# Patient Record
Sex: Female | Born: 1940 | Race: White | Hispanic: No | Marital: Married | Smoking: Former smoker
Health system: Southern US, Community
[De-identification: ages and names within clinical notes are randomized; demographics above are authoritative.]

## PROBLEM LIST (undated history)

## (undated) DIAGNOSIS — T7840XA Allergy, unspecified, initial encounter: Secondary | ICD-10-CM

## (undated) DIAGNOSIS — E079 Disorder of thyroid, unspecified: Secondary | ICD-10-CM

## (undated) DIAGNOSIS — G20 Parkinson's disease: Secondary | ICD-10-CM

## (undated) DIAGNOSIS — F0390 Unspecified dementia without behavioral disturbance: Secondary | ICD-10-CM

## (undated) DIAGNOSIS — G20A1 Parkinson's disease without dyskinesia, without mention of fluctuations: Secondary | ICD-10-CM

## (undated) DIAGNOSIS — E785 Hyperlipidemia, unspecified: Secondary | ICD-10-CM

## (undated) DIAGNOSIS — E039 Hypothyroidism, unspecified: Secondary | ICD-10-CM

## (undated) DIAGNOSIS — H269 Unspecified cataract: Secondary | ICD-10-CM

## (undated) HISTORY — DX: Parkinson's disease: G20

## (undated) HISTORY — DX: Allergy, unspecified, initial encounter: T78.40XA

## (undated) HISTORY — PX: CATARACT EXTRACTION, BILATERAL: SHX1313

## (undated) HISTORY — PX: TONSILECTOMY, ADENOIDECTOMY, BILATERAL MYRINGOTOMY AND TUBES: SHX2538

## (undated) HISTORY — DX: Unspecified cataract: H26.9

## (undated) HISTORY — DX: Parkinson's disease without dyskinesia, without mention of fluctuations: G20.A1

## (undated) HISTORY — PX: ABDOMINAL HYSTERECTOMY: SHX81

## (undated) HISTORY — DX: Hyperlipidemia, unspecified: E78.5

## (undated) HISTORY — PX: EYE SURGERY: SHX253

---

## 2008-03-21 ENCOUNTER — Emergency Department (HOSPITAL_COMMUNITY): Admission: EM | Admit: 2008-03-21 | Discharge: 2008-03-21 | Payer: Self-pay | Admitting: Emergency Medicine

## 2010-10-28 ENCOUNTER — Emergency Department (HOSPITAL_BASED_OUTPATIENT_CLINIC_OR_DEPARTMENT_OTHER)
Admission: EM | Admit: 2010-10-28 | Discharge: 2010-10-28 | Disposition: A | Discharge status: Home / Self Care | Payer: Medicare Other | Attending: Emergency Medicine | Admitting: Emergency Medicine

## 2010-10-28 ENCOUNTER — Emergency Department (INDEPENDENT_AMBULATORY_CARE_PROVIDER_SITE_OTHER): Payer: Medicare Other

## 2010-10-28 DIAGNOSIS — R079 Chest pain, unspecified: Secondary | ICD-10-CM

## 2010-10-28 DIAGNOSIS — S0100XA Unspecified open wound of scalp, initial encounter: Secondary | ICD-10-CM | POA: Insufficient documentation

## 2010-10-28 DIAGNOSIS — Y9301 Activity, walking, marching and hiking: Secondary | ICD-10-CM | POA: Insufficient documentation

## 2010-10-28 DIAGNOSIS — I10 Essential (primary) hypertension: Secondary | ICD-10-CM | POA: Insufficient documentation

## 2010-10-28 DIAGNOSIS — W010XXA Fall on same level from slipping, tripping and stumbling without subsequent striking against object, initial encounter: Secondary | ICD-10-CM | POA: Insufficient documentation

## 2010-10-28 DIAGNOSIS — S1093XA Contusion of unspecified part of neck, initial encounter: Secondary | ICD-10-CM

## 2010-10-28 DIAGNOSIS — S0003XA Contusion of scalp, initial encounter: Secondary | ICD-10-CM

## 2010-10-28 DIAGNOSIS — G319 Degenerative disease of nervous system, unspecified: Secondary | ICD-10-CM | POA: Insufficient documentation

## 2010-11-06 ENCOUNTER — Ambulatory Visit: Payer: Medicare Other | Attending: Diagnostic Neuroimaging | Admitting: Physical Therapy

## 2010-11-06 DIAGNOSIS — IMO0001 Reserved for inherently not codable concepts without codable children: Secondary | ICD-10-CM | POA: Insufficient documentation

## 2010-11-06 DIAGNOSIS — R269 Unspecified abnormalities of gait and mobility: Secondary | ICD-10-CM | POA: Insufficient documentation

## 2010-11-15 ENCOUNTER — Ambulatory Visit: Payer: Medicare Other | Admitting: Physical Therapy

## 2010-11-17 ENCOUNTER — Ambulatory Visit: Payer: Medicare Other | Admitting: Physical Therapy

## 2010-11-22 ENCOUNTER — Ambulatory Visit: Payer: Medicare Other | Admitting: Physical Therapy

## 2010-11-23 ENCOUNTER — Ambulatory Visit: Payer: Medicare Other | Admitting: Physical Therapy

## 2010-11-24 ENCOUNTER — Ambulatory Visit: Payer: Medicare Other | Attending: Diagnostic Neuroimaging | Admitting: Physical Therapy

## 2010-11-24 DIAGNOSIS — R269 Unspecified abnormalities of gait and mobility: Secondary | ICD-10-CM | POA: Insufficient documentation

## 2010-11-24 DIAGNOSIS — IMO0001 Reserved for inherently not codable concepts without codable children: Secondary | ICD-10-CM | POA: Insufficient documentation

## 2010-11-27 ENCOUNTER — Ambulatory Visit: Payer: Medicare Other | Admitting: Physical Therapy

## 2010-11-30 ENCOUNTER — Ambulatory Visit: Payer: Medicare Other | Admitting: Physical Therapy

## 2010-12-04 ENCOUNTER — Ambulatory Visit: Payer: Medicare Other | Admitting: Physical Therapy

## 2010-12-07 ENCOUNTER — Ambulatory Visit: Payer: Medicare Other | Admitting: Physical Therapy

## 2010-12-11 ENCOUNTER — Ambulatory Visit: Payer: Medicare Other | Admitting: Physical Therapy

## 2010-12-12 ENCOUNTER — Ambulatory Visit: Payer: Medicare Other | Admitting: Physical Therapy

## 2010-12-14 ENCOUNTER — Ambulatory Visit: Payer: Medicare Other | Admitting: Physical Therapy

## 2011-11-09 DIAGNOSIS — R2689 Other abnormalities of gait and mobility: Secondary | ICD-10-CM | POA: Insufficient documentation

## 2011-11-09 DIAGNOSIS — R4189 Other symptoms and signs involving cognitive functions and awareness: Secondary | ICD-10-CM | POA: Insufficient documentation

## 2013-02-22 ENCOUNTER — Ambulatory Visit (INDEPENDENT_AMBULATORY_CARE_PROVIDER_SITE_OTHER): Payer: Medicare Other | Admitting: Family Medicine

## 2013-02-22 VITALS — BP 136/80 | HR 90 | Temp 98.2°F | Resp 17

## 2013-02-22 DIAGNOSIS — L0201 Cutaneous abscess of face: Secondary | ICD-10-CM

## 2013-02-22 DIAGNOSIS — L03211 Cellulitis of face: Secondary | ICD-10-CM

## 2013-02-22 DIAGNOSIS — R300 Dysuria: Secondary | ICD-10-CM

## 2013-02-22 LAB — POCT CBC
Granulocyte percent: 71.5 %G (ref 37–80)
HCT, POC: 42.5 % (ref 37.7–47.9)
Hemoglobin: 13.4 g/dL (ref 12.2–16.2)
Lymph, poc: 1.4 (ref 0.6–3.4)
MCH, POC: 28.9 pg (ref 27–31.2)
MCHC: 31.5 g/dL — AB (ref 31.8–35.4)
MCV: 91.5 fL (ref 80–97)
MID (cbc): 0.4 (ref 0–0.9)
MPV: 8.9 fL (ref 0–99.8)
POC Granulocyte: 4.6 (ref 2–6.9)
POC LYMPH PERCENT: 21.9 %L (ref 10–50)
POC MID %: 6.6 %M (ref 0–12)
Platelet Count, POC: 287 10*3/uL (ref 142–424)
RBC: 4.64 M/uL (ref 4.04–5.48)
RDW, POC: 14.8 %
WBC: 6.4 10*3/uL (ref 4.6–10.2)

## 2013-02-22 LAB — POCT URINALYSIS DIPSTICK
Bilirubin, UA: NEGATIVE
Glucose, UA: NEGATIVE
Ketones, UA: NEGATIVE
Nitrite, UA: NEGATIVE
Protein, UA: NEGATIVE
Spec Grav, UA: 1.015
Urobilinogen, UA: 0.2
pH, UA: 7

## 2013-02-22 LAB — POCT UA - MICROSCOPIC ONLY
Bacteria, U Microscopic: NEGATIVE
Casts, Ur, LPF, POC: NEGATIVE
Crystals, Ur, HPF, POC: NEGATIVE
Mucus, UA: NEGATIVE
Yeast, UA: NEGATIVE

## 2013-02-22 MED ORDER — CEPHALEXIN 250 MG PO CAPS: 250.0000 mg | ORAL_CAPSULE | Freq: Three times a day (TID) | ORAL | Status: DC

## 2013-02-22 NOTE — Progress Notes (Signed)
72 year old woman was brought in with her husband because of facial rash for one month. She's been using Neosporin and actually applied a Band-Aid over the area with subsequent increase in erythema. The rash is located on her left cheek is progressed by erythema and thickening of the skin. She has no fever or itching.  Patient also has some dysuria over the last week. She has a followup appointment with her PCP in late September.  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 She does have some blurring of her vision and has difficulty getting started. She has no tremor is mentally quite sharp.  Objective: Alert with good eye contact and conjugate gaze. Skin: 4 mm blemish on left cheek with surrounding erythema corresponding to Band-Aid location. It is covered with petroleum jelly type application.  Patient is a wheelchair, has no edema, is alert and appropriate.  Results for orders placed in visit on 02/22/13  POCT CBC      Result Value Range   WBC 6.4  4.6 - 10.2 K/uL   Lymph, poc 1.4  0.6 - 3.4   POC LYMPH PERCENT 21.9  10 - 50 %L   MID (cbc) 0.4  0 - 0.9   POC MID % 6.6  0 - 12 %M   POC Granulocyte 4.6  2 - 6.9   Granulocyte percent 71.5  37 - 80 %G   RBC 4.64  4.04 - 5.48 M/uL   Hemoglobin 13.4  12.2 - 16.2 g/dL   HCT, POC 30.8  65.7 - 47.9 %   MCV 91.5  80 - 97 fL   MCH, POC 28.9  27 - 31.2 pg   MCHC 31.5 (*) 31.8 - 35.4 g/dL   RDW, POC 84.6     Platelet Count, POC 287  142 - 424 K/uL   MPV 8.9  0 - 99.8 fL  POCT URINALYSIS DIPSTICK      Result Value Range   Color, UA yellow     Clarity, UA clear     Glucose, UA neg     Bilirubin, UA neg     Ketones, UA neg     Spec Grav, UA 1.015     Blood, UA trace-intact     pH, UA 7.0     Protein, UA neg     Urobilinogen, UA 0.2     Nitrite, UA neg     Leukocytes, UA Trace    POCT UA - MICROSCOPIC ONLY      Result Value Range   WBC, Ur, HPF, POC 0-4     RBC, urine,  microscopic 1-6     Bacteria, U Microscopic neg     Mucus, UA neg     Epithelial cells, urine per micros 0-2     Crystals, Ur, HPF, POC neg     Casts, Ur, LPF, POC neg     Yeast, UA neg     Assessment: Mild cellulitis left cheek with secondary contact dermatitis. Possible early UTI

## 2013-02-23 LAB — URINE CULTURE
Colony Count: NO GROWTH
Organism ID, Bacteria: NO GROWTH

## 2013-05-07 ENCOUNTER — Ambulatory Visit
Admission: RE | Admit: 2013-05-07 | Discharge: 2013-05-07 | Disposition: A | Discharge status: Home / Self Care | Payer: Medicare Other | Source: Ambulatory Visit | Attending: Family Medicine | Admitting: Family Medicine

## 2013-05-07 ENCOUNTER — Ambulatory Visit (INDEPENDENT_AMBULATORY_CARE_PROVIDER_SITE_OTHER): Payer: Medicare Other | Admitting: Family Medicine

## 2013-05-07 ENCOUNTER — Telehealth: Payer: Self-pay | Admitting: Radiology

## 2013-05-07 VITALS — BP 125/76 | HR 87 | Temp 97.4°F | Resp 16 | Ht 63.5 in | Wt 161.2 lb

## 2013-05-07 DIAGNOSIS — S0093XA Contusion of unspecified part of head, initial encounter: Secondary | ICD-10-CM

## 2013-05-07 DIAGNOSIS — R51 Headache: Secondary | ICD-10-CM

## 2013-05-07 DIAGNOSIS — S0990XA Unspecified injury of head, initial encounter: Secondary | ICD-10-CM

## 2013-05-07 DIAGNOSIS — S0003XA Contusion of scalp, initial encounter: Secondary | ICD-10-CM

## 2013-05-07 NOTE — Telephone Encounter (Signed)
Patients CT report is in the computer, patient has gone home.

## 2013-05-07 NOTE — Progress Notes (Addendum)
Subjective:    Patient ID: Elizabeth Bauer, female    DOB: 30-Jun-1940, 72 y.o.   MRN: [id number]  This chart was scribed for Meredith Staggers, MD by Dorothey Baseman, ED Scribe.   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 Female with a history of Parkinson's Disease who presents to [facility] complaining of a head injury that occurred 4 days ago when she states that she fell onto a tile, bathroom floor and hit the back of her head. Patient reports that she was using a door for support, but that she beleives the door swung away from her and she lost her balance. She reports an associated knot to the back of the head that she states appears to be improving at this time. She reports some mild associated nausea after the incident and one episode of pink-colored discharge when she blew her nose that has since resolved. She reports applying ice to the area and taking Aleve at home with moderate, temporary relief of her symptoms for 2 days, but states that a headache presented this morning that woke her from sleep. She denies syncope, emesis, visual disturbance, lightheadedness, dizziness, or new weakness. Patient denies any daily anticoagulant use, but states that she takes a low-dose of Aspirin occasionally.  There are no active problems to display for this patient.  Past Medical History  Diagnosis Date  . Cataract   . Parkinson disease    Past Surgical History  Procedure Laterality Date  . Eye surgery    . Abdominal hysterectomy    . Tonsilectomy, adenoidectomy, bilateral myringotomy and tubes     No Known Allergies Prior to Admission medications   Medication Sig Start Date End Date Taking? Authorizing Provider  carbidopa-levodopa (SINEMET IR) 25-100 MG per tablet Take 1 tablet by mouth 3 (three) times daily.   Yes Historical Provider, MD  levothyroxine (SYNTHROID, LEVOTHROID) 112 MCG tablet Take 112 mcg by mouth daily before breakfast.   Yes Historical Provider, MD   cephALEXin (KEFLEX) 250 MG capsule Take 1 capsule (250 mg total) by mouth 3 (three) times daily. 02/22/13   Elvina Sidle, MD   History   Social History  . Marital Status: Married    Spouse Name: N/A    Number of Children: N/A  . Years of Education: N/A   Occupational History  . Not on file.   Social History Main Topics  . Smoking status: Never Smoker   . Smokeless tobacco: Not on file  . Alcohol Use: Yes  . Drug Use: No  . Sexual Activity: Not on file   Other Topics Concern  . Not on file   Social History Narrative  . No narrative on file   Review of Systems  Eyes: Negative for visual disturbance.  Gastrointestinal: Positive for nausea. Negative for vomiting.  Neurological: Positive for headaches. Negative for dizziness, syncope, weakness and light-headedness.      Objective:   Physical Exam  Nursing note and vitals reviewed. Constitutional: She is oriented to person, place, and time. She appears well-developed and well-nourished. No distress.  HENT:  Head: Normocephalic. Head is without raccoon's eyes and without Battle's sign.  Right Ear: Tympanic membrane, external ear and ear canal normal. No hemotympanum.  Left Ear: Tympanic membrane, external ear and ear canal normal. No hemotympanum.  Faded ecchymosis. Mild soft tissue swelling to the left occiput. Palate equal elevation.   Eyes: Conjunctivae and EOM are normal. Pupils are equal, round, and reactive  to light. Right eye exhibits no discharge. Left eye exhibits no discharge.  Some cerumen in the right canal. No nystagmus.   Neck: Normal range of motion. Neck supple.  No carotid bruit.  Cardiovascular: Normal rate, regular rhythm and normal heart sounds.   No murmur heard. Pulmonary/Chest: Effort normal and breath sounds normal. No respiratory distress. She has no wheezes. She has no rales.  Abdominal: She exhibits no distension.  Musculoskeletal: Normal range of motion.  Neurological: She is alert and  oriented to person, place, and time.  Equal facial movements and strength. Equal strength of upper and lower extremities bilaterally. Negative Romberg. Negative pronator drift.  Skin: Skin is warm and dry.  Psychiatric: She has a normal mood and affect. Her behavior is normal.   BP 125/76  Pulse 87  Temp(Src) 97.4 F (36.3 C) (Oral)  Resp 16  Ht 5' 3.5" (1.613 m)  Wt 161 lb 3.2 oz (73.12 kg)  BMI 28.10 kg/m2  SpO2 96%     Assessment & Plan:  9:14 AM- Will order a CT of the head. Advised patient to take Tylenol and to apply cold compresses to the area at home to manage her symptoms. Discussed treatment plan with patient at bedside and patient verbalized agreement.   Elizabeth Bauer is a 72 y.o. female Headache(784.0) - Plan: CT Head Wo Contrast  Head injury, unspecified - Plan: CT Head Wo Contrast  Contusion of head, initial encounter - Plan: CT Head Wo Contrast  Contusion of occiput with hematoma, initial improvement then recurrence of HA to wake from sleep this am - will obtain CT head today. nonfocal exam and HA free at present, so will have this done outpatient.   No orders of the defined types were placed in this encounter.   Patient Instructions  We will schedule the CT scan today, but return to the clinic or go to the nearest emergency room if any of your symptoms worsen or new symptoms occur. Tylenol if needed.  HEAD INJURY If any of the following occur notify your physician or go to the Hospital Emergency Department: . Increased drowsiness, stupor or loss of consciousness . Restlessness or convulsions (fits) . Paralysis in arms or legs . Temperature above 100 F . Vomiting . Severe headache . Blood or clear fluid dripping from the nose or ears . Stiffness of the neck . Dizziness or blurred vision . Pulsating pain in the eye . Unequal pupils of eye . Personality changes . Any other unusual symptoms PRECAUTIONS . Keep head elevated at all times for the first 24  hours (Elevate mattress if pillow is ineffective) . Do not take tranquilizers, sedatives, narcotics or alcohol . Avoid aspirin. Use only acetaminophen (e.g. Tylenol) or ibuprofen (e.g. Advil) for relief of pain. Follow directions on the bottle for dosage. . Use ice packs for comfort MEDICATIONS Use medications only as directed by your physician     I personally performed the services described in this documentation, which was scribed in my presence. The recorded information has been reviewed and considered, and addended by me as needed.     1:11 PM CT report noted. No acute findings. 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  No acute intracranial findings or skull fracture.   called home number - left message that I will try other number.  Reached patient on cell: advised of results and plan of care as per OV earlier, with continued ER/RTC head injury precautions. Understanding  expressed.

## 2013-05-07 NOTE — Patient Instructions (Signed)
We will schedule the CT scan today, but return to the clinic or go to the nearest emergency room if any of your symptoms worsen or new symptoms occur. Tylenol if needed.  HEAD INJURY If any of the following occur notify your physician or go to the Hospital Emergency Department: . Increased drowsiness, stupor or loss of consciousness . Restlessness or convulsions (fits) . Paralysis in arms or legs . Temperature above 100 F . Vomiting . Severe headache . Blood or clear fluid dripping from the nose or ears . Stiffness of the neck . Dizziness or blurred vision . Pulsating pain in the eye . Unequal pupils of eye . Personality changes . Any other unusual symptoms PRECAUTIONS . Keep head elevated at all times for the first 24 hours (Elevate mattress if pillow is ineffective) . Do not take tranquilizers, sedatives, narcotics or alcohol . Avoid aspirin. Use only acetaminophen (e.g. Tylenol) or ibuprofen (e.g. Advil) for relief of pain. Follow directions on the bottle for dosage. . Use ice packs for comfort MEDICATIONS Use medications only as directed by your physician

## 2015-07-01 DIAGNOSIS — R310 Gross hematuria: Secondary | ICD-10-CM | POA: Diagnosis not present

## 2015-07-01 DIAGNOSIS — N390 Urinary tract infection, site not specified: Secondary | ICD-10-CM | POA: Diagnosis not present

## 2015-07-01 DIAGNOSIS — Z0000 Encounter for general adult medical examination without abnormal findings: Secondary | ICD-10-CM | POA: Diagnosis not present

## 2015-07-01 DIAGNOSIS — N3946 Mixed incontinence: Secondary | ICD-10-CM | POA: Diagnosis not present

## 2015-07-07 DIAGNOSIS — R269 Unspecified abnormalities of gait and mobility: Secondary | ICD-10-CM | POA: Diagnosis not present

## 2015-07-07 DIAGNOSIS — G20 Parkinson's disease: Secondary | ICD-10-CM | POA: Diagnosis not present

## 2015-07-11 DIAGNOSIS — Z0000 Encounter for general adult medical examination without abnormal findings: Secondary | ICD-10-CM | POA: Diagnosis not present

## 2015-07-11 DIAGNOSIS — N3001 Acute cystitis with hematuria: Secondary | ICD-10-CM | POA: Diagnosis not present

## 2015-07-11 DIAGNOSIS — N362 Urethral caruncle: Secondary | ICD-10-CM | POA: Diagnosis not present

## 2015-07-11 DIAGNOSIS — R310 Gross hematuria: Secondary | ICD-10-CM | POA: Diagnosis not present

## 2015-08-07 DIAGNOSIS — R269 Unspecified abnormalities of gait and mobility: Secondary | ICD-10-CM | POA: Diagnosis not present

## 2015-08-07 DIAGNOSIS — G20 Parkinson's disease: Secondary | ICD-10-CM | POA: Diagnosis not present

## 2015-09-01 DIAGNOSIS — I10 Essential (primary) hypertension: Secondary | ICD-10-CM | POA: Diagnosis not present

## 2015-09-01 DIAGNOSIS — R1310 Dysphagia, unspecified: Secondary | ICD-10-CM | POA: Diagnosis not present

## 2015-09-01 DIAGNOSIS — G4752 REM sleep behavior disorder: Secondary | ICD-10-CM | POA: Diagnosis not present

## 2015-09-01 DIAGNOSIS — R1319 Other dysphagia: Secondary | ICD-10-CM | POA: Diagnosis not present

## 2015-09-01 DIAGNOSIS — G20 Parkinson's disease: Secondary | ICD-10-CM | POA: Diagnosis not present

## 2015-09-01 DIAGNOSIS — Z7982 Long term (current) use of aspirin: Secondary | ICD-10-CM | POA: Diagnosis not present

## 2015-09-04 DIAGNOSIS — R269 Unspecified abnormalities of gait and mobility: Secondary | ICD-10-CM | POA: Diagnosis not present

## 2015-09-04 DIAGNOSIS — G20 Parkinson's disease: Secondary | ICD-10-CM | POA: Diagnosis not present

## 2015-10-05 DIAGNOSIS — G20 Parkinson's disease: Secondary | ICD-10-CM | POA: Diagnosis not present

## 2015-10-05 DIAGNOSIS — R269 Unspecified abnormalities of gait and mobility: Secondary | ICD-10-CM | POA: Diagnosis not present

## 2015-10-27 DIAGNOSIS — R633 Feeding difficulties: Secondary | ICD-10-CM | POA: Diagnosis not present

## 2015-10-27 DIAGNOSIS — R1319 Other dysphagia: Secondary | ICD-10-CM | POA: Diagnosis not present

## 2015-10-27 DIAGNOSIS — G20 Parkinson's disease: Secondary | ICD-10-CM | POA: Diagnosis not present

## 2015-11-04 DIAGNOSIS — G20 Parkinson's disease: Secondary | ICD-10-CM | POA: Diagnosis not present

## 2015-11-04 DIAGNOSIS — R269 Unspecified abnormalities of gait and mobility: Secondary | ICD-10-CM | POA: Diagnosis not present

## 2015-11-08 DIAGNOSIS — G20 Parkinson's disease: Secondary | ICD-10-CM | POA: Diagnosis not present

## 2015-11-08 DIAGNOSIS — Z0000 Encounter for general adult medical examination without abnormal findings: Secondary | ICD-10-CM | POA: Diagnosis not present

## 2015-11-08 DIAGNOSIS — E039 Hypothyroidism, unspecified: Secondary | ICD-10-CM | POA: Diagnosis not present

## 2015-11-08 DIAGNOSIS — K229 Disease of esophagus, unspecified: Secondary | ICD-10-CM | POA: Diagnosis not present

## 2015-11-14 DIAGNOSIS — E039 Hypothyroidism, unspecified: Secondary | ICD-10-CM | POA: Diagnosis not present

## 2015-11-14 DIAGNOSIS — G479 Sleep disorder, unspecified: Secondary | ICD-10-CM | POA: Diagnosis not present

## 2015-11-14 DIAGNOSIS — R350 Frequency of micturition: Secondary | ICD-10-CM | POA: Diagnosis not present

## 2015-11-14 DIAGNOSIS — Z79899 Other long term (current) drug therapy: Secondary | ICD-10-CM | POA: Diagnosis not present

## 2015-11-14 DIAGNOSIS — G231 Progressive supranuclear ophthalmoplegia [Steele-Richardson-Olszewski]: Secondary | ICD-10-CM | POA: Diagnosis not present

## 2015-11-14 DIAGNOSIS — R2681 Unsteadiness on feet: Secondary | ICD-10-CM | POA: Diagnosis not present

## 2015-11-14 DIAGNOSIS — J309 Allergic rhinitis, unspecified: Secondary | ICD-10-CM | POA: Diagnosis not present

## 2015-11-16 DIAGNOSIS — R1312 Dysphagia, oropharyngeal phase: Secondary | ICD-10-CM | POA: Diagnosis not present

## 2015-12-05 DIAGNOSIS — G20 Parkinson's disease: Secondary | ICD-10-CM | POA: Diagnosis not present

## 2015-12-05 DIAGNOSIS — R269 Unspecified abnormalities of gait and mobility: Secondary | ICD-10-CM | POA: Diagnosis not present

## 2016-01-04 DIAGNOSIS — G20 Parkinson's disease: Secondary | ICD-10-CM | POA: Diagnosis not present

## 2016-01-04 DIAGNOSIS — R269 Unspecified abnormalities of gait and mobility: Secondary | ICD-10-CM | POA: Diagnosis not present

## 2016-01-17 ENCOUNTER — Emergency Department (HOSPITAL_COMMUNITY)
Admission: EM | Admit: 2016-01-17 | Discharge: 2016-01-17 | Disposition: A | Discharge status: Home / Self Care | Payer: Medicare HMO | Attending: Emergency Medicine | Admitting: Emergency Medicine

## 2016-01-17 ENCOUNTER — Encounter (HOSPITAL_COMMUNITY): Payer: Self-pay

## 2016-01-17 ENCOUNTER — Emergency Department (HOSPITAL_COMMUNITY): Payer: Medicare HMO

## 2016-01-17 DIAGNOSIS — R296 Repeated falls: Secondary | ICD-10-CM | POA: Diagnosis not present

## 2016-01-17 DIAGNOSIS — S0990XA Unspecified injury of head, initial encounter: Secondary | ICD-10-CM | POA: Diagnosis present

## 2016-01-17 DIAGNOSIS — Z87891 Personal history of nicotine dependence: Secondary | ICD-10-CM | POA: Diagnosis not present

## 2016-01-17 DIAGNOSIS — Y999 Unspecified external cause status: Secondary | ICD-10-CM | POA: Insufficient documentation

## 2016-01-17 DIAGNOSIS — Y92002 Bathroom of unspecified non-institutional (private) residence single-family (private) house as the place of occurrence of the external cause: Secondary | ICD-10-CM | POA: Diagnosis not present

## 2016-01-17 DIAGNOSIS — Y939 Activity, unspecified: Secondary | ICD-10-CM | POA: Diagnosis not present

## 2016-01-17 DIAGNOSIS — Z7982 Long term (current) use of aspirin: Secondary | ICD-10-CM | POA: Diagnosis not present

## 2016-01-17 DIAGNOSIS — Z79899 Other long term (current) drug therapy: Secondary | ICD-10-CM | POA: Insufficient documentation

## 2016-01-17 DIAGNOSIS — G20 Parkinson's disease: Secondary | ICD-10-CM | POA: Diagnosis not present

## 2016-01-17 DIAGNOSIS — S40022A Contusion of left upper arm, initial encounter: Secondary | ICD-10-CM | POA: Diagnosis not present

## 2016-01-17 DIAGNOSIS — W1800XA Striking against unspecified object with subsequent fall, initial encounter: Secondary | ICD-10-CM | POA: Insufficient documentation

## 2016-01-17 DIAGNOSIS — N390 Urinary tract infection, site not specified: Secondary | ICD-10-CM

## 2016-01-17 DIAGNOSIS — W19XXXA Unspecified fall, initial encounter: Secondary | ICD-10-CM

## 2016-01-17 LAB — CBC
HCT: 40.7 % (ref 36.0–46.0)
Hemoglobin: 13.2 g/dL (ref 12.0–15.0)
MCH: 29.4 pg (ref 26.0–34.0)
MCHC: 32.4 g/dL (ref 30.0–36.0)
MCV: 90.6 fL (ref 78.0–100.0)
Platelets: 329 10*3/uL (ref 150–400)
RBC: 4.49 MIL/uL (ref 3.87–5.11)
RDW: 13.9 % (ref 11.5–15.5)
WBC: 6.2 10*3/uL (ref 4.0–10.5)

## 2016-01-17 LAB — URINALYSIS, ROUTINE W REFLEX MICROSCOPIC
Bilirubin Urine: NEGATIVE
Glucose, UA: NEGATIVE mg/dL
Hgb urine dipstick: NEGATIVE
Ketones, ur: NEGATIVE mg/dL
Nitrite: NEGATIVE
Protein, ur: NEGATIVE mg/dL
Specific Gravity, Urine: 1.014 (ref 1.005–1.030)
pH: 7 (ref 5.0–8.0)

## 2016-01-17 LAB — BASIC METABOLIC PANEL
Anion gap: 6 (ref 5–15)
BUN: 30 mg/dL — ABNORMAL HIGH (ref 6–20)
CO2: 27 mmol/L (ref 22–32)
Calcium: 9.5 mg/dL (ref 8.9–10.3)
Chloride: 106 mmol/L (ref 101–111)
Creatinine, Ser: 0.81 mg/dL (ref 0.44–1.00)
GFR calc Af Amer: >60 mL/min (ref >60)
GFR calc non Af Amer: >60 mL/min (ref >60)
Glucose, Bld: 154 mg/dL — ABNORMAL HIGH (ref 65–99)
Potassium: 3.5 mmol/L (ref 3.5–5.1)
Sodium: 139 mmol/L (ref 135–145)

## 2016-01-17 LAB — URINE MICROSCOPIC-ADD ON: RBC / HPF: NONE SEEN RBC/hpf (ref 0–5)

## 2016-01-17 LAB — CBG MONITORING, ED: Glucose-Capillary: 117 mg/dL — ABNORMAL HIGH (ref 65–99)

## 2016-01-17 MED ORDER — CEPHALEXIN 500 MG PO CAPS: 500.0000 mg | ORAL_CAPSULE | Freq: Four times a day (QID) | ORAL | 0 refills | Status: DC

## 2016-01-17 NOTE — Discharge Instructions (Signed)
Take your medications as prescribed. You may take Tylenol as prescribed over the counter as needed for pain relief. You may also apply ice your left arm for 15 minutes 3-4 times daily as needed for pain. Take all of your antibiotic as prescribed until finished. Follow-up with your family doctor within the next week. Recommend following up with her neurologist and a scheduled appointment. Please return to the Emergency Department if symptoms worsen or new onset of fever, headache, visual changes, confusion, chest pain, shortness of breath, vomiting, abdominal pain, weakness.

## 2016-01-17 NOTE — ED Notes (Signed)
Patient transported to CT 

## 2016-01-17 NOTE — ED Provider Notes (Signed)
[facility] DEPT Provider Note   CSN: [id number] Arrival date & time: 01/17/16  1633  First Provider Contact:  First MD Initiated Contact with Patient 01/17/16 1820        History   Chief Complaint Chief Complaint  Patient presents with  . Weakness  . Fall    HPI Elizabeth Bauer is a 75 y.o. female.  Patient is a 75 year old female past medical history of parkinson's/progressive supranuclear palsy who presents the ED with complaint of multiple falls. Patient reports she has had 3 falls over the past week. She reports hitting her head on the bathroom vanity 2 days ago. Denies LOC. Patient reports history of falls due to having balance problems related to her parkinson's but notes she typically does not have falls as frequently as she has had over the past week. Patient reports her most recent fall with this morning while she was in her bedroom closet on carpeted floor. She reports landing on her side. Patient endorses having a bruise to her left upper arm that is tender to palpation. Denies any other physician symptoms or pain. Denies fever, chills, headache, visual changes, lightheadedness, dizziness, cough, chest pain, shortness of breath, palpitations, abdominal pain, nausea, vomiting, diarrhea, urinary symptoms, numbness, tingling, weakness. Patient denies use of anticoagulants.    Weakness   Fall     Past Medical History:  Diagnosis Date  . Cataract   . Parkinson disease ([place])     There are no active problems to display for this patient.   Past Surgical History:  Procedure Laterality Date  . ABDOMINAL HYSTERECTOMY    . EYE SURGERY    . TONSILECTOMY, ADENOIDECTOMY, BILATERAL MYRINGOTOMY AND TUBES      OB History    No data available       Home Medications    Prior to Admission medications   Medication Sig Start Date End Date Taking? Authorizing Provider  aspirin EC 81 MG tablet Take 81 mg by mouth every evening.   Yes Historical Provider, MD  [place],  Vaccinium myrtillus, (BILBERRY PO) Take 1 tablet by mouth daily.   Yes Historical Provider, MD  BIOTIN PO Take 1 tablet by mouth 2 (two) times daily.   Yes Historical Provider, MD  carbidopa-levodopa (SINEMET IR) 25-100 MG per tablet Take 0.5 tablets by mouth 2 (two) times daily.    Yes Historical Provider, MD  cetirizine (ZYRTEC) 10 MG tablet Take 10 mg by mouth daily.   Yes Historical Provider, MD  clonazePAM (KLONOPIN) 0.5 MG tablet Take 0.5 mg by mouth at bedtime. 01/03/16  Yes Historical Provider, MD  diphenhydramine-acetaminophen (TYLENOL PM) 25-500 MG TABS tablet Take 2 tablets by mouth at bedtime.   Yes Historical Provider, MD  fluticasone (FLONASE) 50 MCG/ACT nasal spray Place 1-2 sprays into both nostrils daily as needed for allergies or rhinitis.   Yes Historical Provider, MD  levothyroxine (SYNTHROID, LEVOTHROID) 88 MCG tablet Take 88 mcg by mouth daily. 11/25/15  Yes Historical Provider, MD  Multiple Vitamin (MULTI-VITAMINS) TABS Take 1 tablet by mouth daily.   Yes Historical Provider, MD  Probiotic CAPS Take 1 capsule by mouth daily.   Yes Historical Provider, MD  sertraline (ZOLOFT) 50 MG tablet Take 25 mg by mouth daily. 01/04/16  Yes Historical Provider, MD  TURMERIC PO Take 1 tablet by mouth daily.   Yes Historical Provider, MD  VESICARE 10 MG tablet Take 5 mg by mouth at bedtime.  11/22/15  Yes Historical Provider, MD    Family History History  reviewed. No pertinent family history.  Social History Social History  Substance Use Topics  . Smoking status: Former Research scientist (life sciences)  . Smokeless tobacco: Never Used  . Alcohol use Yes     Allergies   Review of patient's allergies indicates no known allergies.   Review of Systems Review of Systems  Skin: Positive for wound (bruise).  All other systems reviewed and are negative.    Physical Exam Updated Vital Signs BP 154/79 (BP Location: Left Arm)   Pulse 80   Temp 97.9 F (36.6 C) (Oral)   Resp 18   SpO2 97%   Physical Exam   Constitutional: She is oriented to person, place, and time. She appears well-developed and well-nourished. No distress.  HENT:  Head: Normocephalic and atraumatic. Head is without raccoon's eyes, without Battle's sign, without abrasion, without contusion and without laceration.  Right Ear: Tympanic membrane normal. No hemotympanum.  Left Ear: Tympanic membrane normal. No hemotympanum.  Nose: Nose normal. No sinus tenderness, nasal deformity, septal deviation or nasal septal hematoma. No epistaxis.  Mouth/Throat: Uvula is midline, oropharynx is clear and moist and mucous membranes are normal. No oropharyngeal exudate, posterior oropharyngeal edema, posterior oropharyngeal erythema or tonsillar abscesses.  Eyes: Conjunctivae and EOM are normal. Pupils are equal, round, and reactive to light. Right eye exhibits no discharge. Left eye exhibits no discharge. No scleral icterus.  Neck: Normal range of motion. Neck supple.  Cardiovascular: Normal rate, regular rhythm, normal heart sounds and intact distal pulses.   Pulmonary/Chest: Effort normal and breath sounds normal. No respiratory distress. She has no wheezes. She has no rales. She exhibits no tenderness.  Abdominal: Soft. Bowel sounds are normal. She exhibits no distension and no mass. There is no tenderness. There is no rebound and no guarding. No hernia.  Musculoskeletal: Normal range of motion. She exhibits no edema.  No cervical, thoracic, or lumbar spine midline TTP. Full ROM of bilateral upper and lower extremities with 5/5 strength. FROM of bilateral hips, no pelvic instability. 2+ radial and PT pulses. Sensation grossly intact.   Neurological: She is alert and oriented to person, place, and time. She has normal strength. No cranial nerve deficit or sensory deficit. She displays a negative Romberg sign. Coordination and gait normal.  Skin: Skin is warm and dry. She is not diaphoretic.  Large ecchymoses noted to left upper arm.  Nursing  note and vitals reviewed.    ED Treatments / Results  Labs (all labs ordered are listed, but only abnormal results are displayed) Labs Reviewed  BASIC METABOLIC PANEL - Abnormal; Notable for the following:       Result Value   Glucose, Bld 154 (*)    BUN 30 (*)    All other components within normal limits  CBG MONITORING, ED - Abnormal; Notable for the following:    Glucose-Capillary 117 (*)    All other components within normal limits  CBC  URINALYSIS, ROUTINE W REFLEX MICROSCOPIC (NOT AT [facility])    EKG  EKG Interpretation None       Radiology No results found.  Procedures Procedures (including critical care time)  Medications Ordered in ED Medications - No data to display   Initial Impression / Assessment and Plan / ED Course  I have reviewed the triage vital signs and the nursing notes.  Pertinent labs & imaging results that were available during my care of the patient were reviewed by me and considered in my medical decision making (see chart for details).  Clinical Course  Patient presents status post fall. Patient reports having multiple falls this week. History of chronic imbalance due to Parkinson's. Patient endorses hitting her head with her fall a few days ago. Denies LOC. Denies use of anticoagulants. VSS. Exam revealed ecchymoses to left upper arm, no evidence of head injury or trauma, no neuro deficits. CT head negative. UA consistent with UTI, urine culture sent. Remaining labs unremarkable. Patient able to stand and ambulate with cane which she reports using at home. Discussed results and plan for discharge with patient. Advised patient to follow up with her PCP this week and also recommended following up with her neurologist regarding further evaluation of her multiple falls. Discussed return precautions with patient.  Final Clinical Impressions(s) / ED Diagnoses   Final diagnoses:  None    New Prescriptions New Prescriptions   No medications on  file     Nona Dell, PA-C 01/18/16 [place], MD 01/18/16 1504

## 2016-01-17 NOTE — ED Triage Notes (Signed)
Pt has parkinson's.  Pt has had increase in falls since Thursday.  3 falls.  No LOC.  Pt denies pain on assessment.  Pt told to come and have her head checked out.  Pt did hit head on Thursday.  No unilateral weakness or deficit noted.

## 2016-01-20 LAB — URINE CULTURE: Culture: >=100000 — AB

## 2016-01-21 ENCOUNTER — Telehealth (HOSPITAL_BASED_OUTPATIENT_CLINIC_OR_DEPARTMENT_OTHER): Payer: Self-pay

## 2016-01-21 NOTE — Telephone Encounter (Signed)
Post ED Visit - Positive Culture Follow-up  Culture report reviewed by antimicrobial stewardship pharmacist:  []  Elenor Quinones, Pharm.D. []  Heide Guile, Pharm.D., BCPS []  Parks Neptune, Pharm.D. []  Alycia Rossetti, Pharm.D., BCPS []  [place], [place].D., BCPS, AAHIVP []  Legrand Como, Pharm.D., BCPS, AAHIVP []  Milus Glazier, Pharm.D. []  Rob Chester, Pharm.D. Ebony Hail Masters Pharm D Positive urine culture Treated with Cephalexin, organism sensitive to the same and no further patient follow-up is required at this time.  Genia Del 01/21/2016, 9:48 AM

## 2016-01-26 DIAGNOSIS — G231 Progressive supranuclear ophthalmoplegia [Steele-Richardson-Olszewski]: Secondary | ICD-10-CM | POA: Diagnosis not present

## 2016-01-26 DIAGNOSIS — N390 Urinary tract infection, site not specified: Secondary | ICD-10-CM | POA: Diagnosis not present

## 2016-01-26 DIAGNOSIS — W19XXXA Unspecified fall, initial encounter: Secondary | ICD-10-CM | POA: Diagnosis not present

## 2016-02-04 DIAGNOSIS — R269 Unspecified abnormalities of gait and mobility: Secondary | ICD-10-CM | POA: Diagnosis not present

## 2016-02-04 DIAGNOSIS — G20 Parkinson's disease: Secondary | ICD-10-CM | POA: Diagnosis not present

## 2016-02-08 DIAGNOSIS — N390 Urinary tract infection, site not specified: Secondary | ICD-10-CM | POA: Diagnosis not present

## 2016-02-23 DIAGNOSIS — R32 Unspecified urinary incontinence: Secondary | ICD-10-CM | POA: Diagnosis not present

## 2016-02-23 DIAGNOSIS — N3020 Other chronic cystitis without hematuria: Secondary | ICD-10-CM | POA: Diagnosis not present

## 2016-02-28 ENCOUNTER — Ambulatory Visit (INDEPENDENT_AMBULATORY_CARE_PROVIDER_SITE_OTHER): Payer: Medicare HMO | Admitting: Podiatry

## 2016-02-28 ENCOUNTER — Encounter: Payer: Self-pay | Admitting: Podiatry

## 2016-02-28 DIAGNOSIS — B351 Tinea unguium: Secondary | ICD-10-CM

## 2016-02-28 DIAGNOSIS — M79676 Pain in unspecified toe(s): Secondary | ICD-10-CM

## 2016-02-28 NOTE — Progress Notes (Signed)
   Subjective:    Patient ID: Elizabeth Bauer, female    DOB: 28-Jan-1941, 75 y.o.   MRN: [id number]  HPI: She presents today with a history of Parkinson's disease and is no longer able to cut her toenails. She is also concerned about a dark nodule to the medial aspect of the left heel.  Review of Systems  Constitutional: Positive for fatigue.  Musculoskeletal: Positive for gait problem.  Neurological: Positive for weakness.  Psychiatric/Behavioral: Positive for confusion.  All other systems reviewed and are negative.      Objective:   Physical Exam: Vital signs are stable she is alert and oriented 3. Pulses are palpable. She has good neurologic sensorium present as well as the monofilament and muscle strength is normal bilateral. Orthopedic evaluation does demonstrate mild hammertoe deformities bilateral cutaneous evaluation of a stress thick yellow dystrophic onychomycotic nails she also has a small venous lake to the medial aspect of the left heel and multiple small nondisplaced type nevi. With dry skin.        Assessment & Plan:  Pain limb secondary to onychomycosis with Parkinson's disease.  Plan: Debridement of toenails 1 through 5 bilateral.

## 2016-03-06 DIAGNOSIS — G20 Parkinson's disease: Secondary | ICD-10-CM | POA: Diagnosis not present

## 2016-03-06 DIAGNOSIS — R269 Unspecified abnormalities of gait and mobility: Secondary | ICD-10-CM | POA: Diagnosis not present

## 2016-03-13 DIAGNOSIS — R69 Illness, unspecified: Secondary | ICD-10-CM | POA: Diagnosis not present

## 2016-04-05 DIAGNOSIS — R269 Unspecified abnormalities of gait and mobility: Secondary | ICD-10-CM | POA: Diagnosis not present

## 2016-04-05 DIAGNOSIS — G20 Parkinson's disease: Secondary | ICD-10-CM | POA: Diagnosis not present

## 2016-04-10 DIAGNOSIS — N3000 Acute cystitis without hematuria: Secondary | ICD-10-CM | POA: Diagnosis not present

## 2016-04-10 DIAGNOSIS — R197 Diarrhea, unspecified: Secondary | ICD-10-CM | POA: Diagnosis not present

## 2016-05-02 DIAGNOSIS — R350 Frequency of micturition: Secondary | ICD-10-CM | POA: Diagnosis not present

## 2016-05-02 DIAGNOSIS — S301XXA Contusion of abdominal wall, initial encounter: Secondary | ICD-10-CM | POA: Diagnosis not present

## 2016-05-10 DIAGNOSIS — G20 Parkinson's disease: Secondary | ICD-10-CM | POA: Diagnosis not present

## 2016-05-10 DIAGNOSIS — I10 Essential (primary) hypertension: Secondary | ICD-10-CM | POA: Diagnosis not present

## 2016-05-10 DIAGNOSIS — Z7982 Long term (current) use of aspirin: Secondary | ICD-10-CM | POA: Diagnosis not present

## 2016-05-10 DIAGNOSIS — R4189 Other symptoms and signs involving cognitive functions and awareness: Secondary | ICD-10-CM | POA: Diagnosis not present

## 2016-05-10 DIAGNOSIS — Z79899 Other long term (current) drug therapy: Secondary | ICD-10-CM | POA: Diagnosis not present

## 2016-05-11 DIAGNOSIS — G231 Progressive supranuclear ophthalmoplegia [Steele-Richardson-Olszewski]: Secondary | ICD-10-CM | POA: Diagnosis not present

## 2016-05-11 DIAGNOSIS — Z23 Encounter for immunization: Secondary | ICD-10-CM | POA: Diagnosis not present

## 2016-05-11 DIAGNOSIS — Z0000 Encounter for general adult medical examination without abnormal findings: Secondary | ICD-10-CM | POA: Diagnosis not present

## 2016-05-11 DIAGNOSIS — Z1389 Encounter for screening for other disorder: Secondary | ICD-10-CM | POA: Diagnosis not present

## 2016-05-11 DIAGNOSIS — R69 Illness, unspecified: Secondary | ICD-10-CM | POA: Diagnosis not present

## 2016-05-30 DIAGNOSIS — R4189 Other symptoms and signs involving cognitive functions and awareness: Secondary | ICD-10-CM | POA: Diagnosis not present

## 2016-05-30 DIAGNOSIS — G20 Parkinson's disease: Secondary | ICD-10-CM | POA: Diagnosis not present

## 2016-05-30 DIAGNOSIS — D320 Benign neoplasm of cerebral meninges: Secondary | ICD-10-CM | POA: Diagnosis not present

## 2016-06-01 ENCOUNTER — Ambulatory Visit: Payer: Medicare HMO | Admitting: Podiatry

## 2016-06-27 DIAGNOSIS — R262 Difficulty in walking, not elsewhere classified: Secondary | ICD-10-CM | POA: Diagnosis not present

## 2016-06-27 DIAGNOSIS — M6281 Muscle weakness (generalized): Secondary | ICD-10-CM | POA: Diagnosis not present

## 2016-06-27 DIAGNOSIS — G20 Parkinson's disease: Secondary | ICD-10-CM | POA: Diagnosis not present

## 2016-06-27 DIAGNOSIS — Z9181 History of falling: Secondary | ICD-10-CM | POA: Diagnosis not present

## 2016-07-03 DIAGNOSIS — R262 Difficulty in walking, not elsewhere classified: Secondary | ICD-10-CM | POA: Diagnosis not present

## 2016-07-03 DIAGNOSIS — Z9181 History of falling: Secondary | ICD-10-CM | POA: Diagnosis not present

## 2016-07-03 DIAGNOSIS — G20 Parkinson's disease: Secondary | ICD-10-CM | POA: Diagnosis not present

## 2016-07-03 DIAGNOSIS — M6281 Muscle weakness (generalized): Secondary | ICD-10-CM | POA: Diagnosis not present

## 2016-07-05 DIAGNOSIS — R262 Difficulty in walking, not elsewhere classified: Secondary | ICD-10-CM | POA: Diagnosis not present

## 2016-07-05 DIAGNOSIS — G20 Parkinson's disease: Secondary | ICD-10-CM | POA: Diagnosis not present

## 2016-07-05 DIAGNOSIS — M6281 Muscle weakness (generalized): Secondary | ICD-10-CM | POA: Diagnosis not present

## 2016-07-05 DIAGNOSIS — Z9181 History of falling: Secondary | ICD-10-CM | POA: Diagnosis not present

## 2016-07-10 DIAGNOSIS — G20 Parkinson's disease: Secondary | ICD-10-CM | POA: Diagnosis not present

## 2016-07-10 DIAGNOSIS — R262 Difficulty in walking, not elsewhere classified: Secondary | ICD-10-CM | POA: Diagnosis not present

## 2016-07-10 DIAGNOSIS — M6281 Muscle weakness (generalized): Secondary | ICD-10-CM | POA: Diagnosis not present

## 2016-07-10 DIAGNOSIS — Z9181 History of falling: Secondary | ICD-10-CM | POA: Diagnosis not present

## 2016-07-13 DIAGNOSIS — G20 Parkinson's disease: Secondary | ICD-10-CM | POA: Diagnosis not present

## 2016-07-13 DIAGNOSIS — R262 Difficulty in walking, not elsewhere classified: Secondary | ICD-10-CM | POA: Diagnosis not present

## 2016-07-13 DIAGNOSIS — M6281 Muscle weakness (generalized): Secondary | ICD-10-CM | POA: Diagnosis not present

## 2016-07-13 DIAGNOSIS — Z9181 History of falling: Secondary | ICD-10-CM | POA: Diagnosis not present

## 2016-07-17 DIAGNOSIS — M6281 Muscle weakness (generalized): Secondary | ICD-10-CM | POA: Diagnosis not present

## 2016-07-17 DIAGNOSIS — Z9181 History of falling: Secondary | ICD-10-CM | POA: Diagnosis not present

## 2016-07-17 DIAGNOSIS — G20 Parkinson's disease: Secondary | ICD-10-CM | POA: Diagnosis not present

## 2016-07-17 DIAGNOSIS — R262 Difficulty in walking, not elsewhere classified: Secondary | ICD-10-CM | POA: Diagnosis not present

## 2016-07-19 DIAGNOSIS — G20 Parkinson's disease: Secondary | ICD-10-CM | POA: Diagnosis not present

## 2016-07-19 DIAGNOSIS — M6281 Muscle weakness (generalized): Secondary | ICD-10-CM | POA: Diagnosis not present

## 2016-07-19 DIAGNOSIS — R262 Difficulty in walking, not elsewhere classified: Secondary | ICD-10-CM | POA: Diagnosis not present

## 2016-07-19 DIAGNOSIS — Z9181 History of falling: Secondary | ICD-10-CM | POA: Diagnosis not present

## 2016-07-24 DIAGNOSIS — Z9181 History of falling: Secondary | ICD-10-CM | POA: Diagnosis not present

## 2016-07-24 DIAGNOSIS — G20 Parkinson's disease: Secondary | ICD-10-CM | POA: Diagnosis not present

## 2016-07-24 DIAGNOSIS — M6281 Muscle weakness (generalized): Secondary | ICD-10-CM | POA: Diagnosis not present

## 2016-07-24 DIAGNOSIS — R262 Difficulty in walking, not elsewhere classified: Secondary | ICD-10-CM | POA: Diagnosis not present

## 2016-07-26 DIAGNOSIS — G20 Parkinson's disease: Secondary | ICD-10-CM | POA: Diagnosis not present

## 2016-07-26 DIAGNOSIS — R262 Difficulty in walking, not elsewhere classified: Secondary | ICD-10-CM | POA: Diagnosis not present

## 2016-07-26 DIAGNOSIS — Z9181 History of falling: Secondary | ICD-10-CM | POA: Diagnosis not present

## 2016-07-26 DIAGNOSIS — M6281 Muscle weakness (generalized): Secondary | ICD-10-CM | POA: Diagnosis not present

## 2016-08-01 DIAGNOSIS — R262 Difficulty in walking, not elsewhere classified: Secondary | ICD-10-CM | POA: Diagnosis not present

## 2016-08-01 DIAGNOSIS — M6281 Muscle weakness (generalized): Secondary | ICD-10-CM | POA: Diagnosis not present

## 2016-08-01 DIAGNOSIS — Z9181 History of falling: Secondary | ICD-10-CM | POA: Diagnosis not present

## 2016-08-01 DIAGNOSIS — G20 Parkinson's disease: Secondary | ICD-10-CM | POA: Diagnosis not present

## 2016-08-02 DIAGNOSIS — M6281 Muscle weakness (generalized): Secondary | ICD-10-CM | POA: Diagnosis not present

## 2016-08-02 DIAGNOSIS — G20 Parkinson's disease: Secondary | ICD-10-CM | POA: Diagnosis not present

## 2016-08-02 DIAGNOSIS — Z9181 History of falling: Secondary | ICD-10-CM | POA: Diagnosis not present

## 2016-08-02 DIAGNOSIS — R262 Difficulty in walking, not elsewhere classified: Secondary | ICD-10-CM | POA: Diagnosis not present

## 2016-08-06 DIAGNOSIS — R262 Difficulty in walking, not elsewhere classified: Secondary | ICD-10-CM | POA: Diagnosis not present

## 2016-08-06 DIAGNOSIS — G20 Parkinson's disease: Secondary | ICD-10-CM | POA: Diagnosis not present

## 2016-08-06 DIAGNOSIS — M6281 Muscle weakness (generalized): Secondary | ICD-10-CM | POA: Diagnosis not present

## 2016-08-06 DIAGNOSIS — Z9181 History of falling: Secondary | ICD-10-CM | POA: Diagnosis not present

## 2016-08-09 DIAGNOSIS — M6281 Muscle weakness (generalized): Secondary | ICD-10-CM | POA: Diagnosis not present

## 2016-08-09 DIAGNOSIS — R262 Difficulty in walking, not elsewhere classified: Secondary | ICD-10-CM | POA: Diagnosis not present

## 2016-08-09 DIAGNOSIS — G20 Parkinson's disease: Secondary | ICD-10-CM | POA: Diagnosis not present

## 2016-08-09 DIAGNOSIS — Z9181 History of falling: Secondary | ICD-10-CM | POA: Diagnosis not present

## 2016-08-10 DIAGNOSIS — R259 Unspecified abnormal involuntary movements: Secondary | ICD-10-CM | POA: Diagnosis not present

## 2016-08-10 DIAGNOSIS — R938 Abnormal findings on diagnostic imaging of other specified body structures: Secondary | ICD-10-CM | POA: Diagnosis not present

## 2016-08-10 DIAGNOSIS — R110 Nausea: Secondary | ICD-10-CM | POA: Diagnosis not present

## 2016-08-10 DIAGNOSIS — G20 Parkinson's disease: Secondary | ICD-10-CM | POA: Diagnosis not present

## 2016-08-10 DIAGNOSIS — R42 Dizziness and giddiness: Secondary | ICD-10-CM | POA: Diagnosis not present

## 2016-08-10 DIAGNOSIS — R2689 Other abnormalities of gait and mobility: Secondary | ICD-10-CM | POA: Diagnosis not present

## 2016-08-10 DIAGNOSIS — R296 Repeated falls: Secondary | ICD-10-CM | POA: Diagnosis not present

## 2016-08-10 DIAGNOSIS — D329 Benign neoplasm of meninges, unspecified: Secondary | ICD-10-CM | POA: Diagnosis not present

## 2016-08-10 DIAGNOSIS — G4752 REM sleep behavior disorder: Secondary | ICD-10-CM | POA: Diagnosis not present

## 2016-08-10 DIAGNOSIS — Z79899 Other long term (current) drug therapy: Secondary | ICD-10-CM | POA: Diagnosis not present

## 2016-08-13 DIAGNOSIS — G20 Parkinson's disease: Secondary | ICD-10-CM | POA: Diagnosis not present

## 2016-08-13 DIAGNOSIS — Z9181 History of falling: Secondary | ICD-10-CM | POA: Diagnosis not present

## 2016-08-13 DIAGNOSIS — R262 Difficulty in walking, not elsewhere classified: Secondary | ICD-10-CM | POA: Diagnosis not present

## 2016-08-13 DIAGNOSIS — M6281 Muscle weakness (generalized): Secondary | ICD-10-CM | POA: Diagnosis not present

## 2016-08-16 DIAGNOSIS — Z9181 History of falling: Secondary | ICD-10-CM | POA: Diagnosis not present

## 2016-08-16 DIAGNOSIS — G20 Parkinson's disease: Secondary | ICD-10-CM | POA: Diagnosis not present

## 2016-08-16 DIAGNOSIS — R262 Difficulty in walking, not elsewhere classified: Secondary | ICD-10-CM | POA: Diagnosis not present

## 2016-08-16 DIAGNOSIS — M6281 Muscle weakness (generalized): Secondary | ICD-10-CM | POA: Diagnosis not present

## 2016-08-20 DIAGNOSIS — R262 Difficulty in walking, not elsewhere classified: Secondary | ICD-10-CM | POA: Diagnosis not present

## 2016-08-20 DIAGNOSIS — G20 Parkinson's disease: Secondary | ICD-10-CM | POA: Diagnosis not present

## 2016-08-20 DIAGNOSIS — Z9181 History of falling: Secondary | ICD-10-CM | POA: Diagnosis not present

## 2016-08-20 DIAGNOSIS — M6281 Muscle weakness (generalized): Secondary | ICD-10-CM | POA: Diagnosis not present

## 2016-08-23 ENCOUNTER — Emergency Department (HOSPITAL_BASED_OUTPATIENT_CLINIC_OR_DEPARTMENT_OTHER): Payer: Medicare HMO

## 2016-08-23 ENCOUNTER — Encounter (HOSPITAL_BASED_OUTPATIENT_CLINIC_OR_DEPARTMENT_OTHER): Payer: Self-pay | Admitting: Emergency Medicine

## 2016-08-23 ENCOUNTER — Observation Stay (HOSPITAL_BASED_OUTPATIENT_CLINIC_OR_DEPARTMENT_OTHER)
Admission: EM | Admit: 2016-08-23 | Discharge: 2016-08-25 | Disposition: A | Discharge status: Home / Self Care | Payer: Medicare HMO | Attending: General Surgery | Admitting: General Surgery

## 2016-08-23 DIAGNOSIS — Y9389 Activity, other specified: Secondary | ICD-10-CM | POA: Insufficient documentation

## 2016-08-23 DIAGNOSIS — M6281 Muscle weakness (generalized): Secondary | ICD-10-CM | POA: Diagnosis not present

## 2016-08-23 DIAGNOSIS — Y92002 Bathroom of unspecified non-institutional (private) residence single-family (private) house as the place of occurrence of the external cause: Secondary | ICD-10-CM | POA: Diagnosis not present

## 2016-08-23 DIAGNOSIS — S199XXA Unspecified injury of neck, initial encounter: Secondary | ICD-10-CM | POA: Diagnosis not present

## 2016-08-23 DIAGNOSIS — E079 Disorder of thyroid, unspecified: Secondary | ICD-10-CM | POA: Diagnosis not present

## 2016-08-23 DIAGNOSIS — S2241XA Multiple fractures of ribs, right side, initial encounter for closed fracture: Secondary | ICD-10-CM | POA: Diagnosis not present

## 2016-08-23 DIAGNOSIS — W19XXXA Unspecified fall, initial encounter: Secondary | ICD-10-CM

## 2016-08-23 DIAGNOSIS — S0990XA Unspecified injury of head, initial encounter: Secondary | ICD-10-CM | POA: Diagnosis not present

## 2016-08-23 DIAGNOSIS — G20 Parkinson's disease: Secondary | ICD-10-CM | POA: Insufficient documentation

## 2016-08-23 DIAGNOSIS — S2239XA Fracture of one rib, unspecified side, initial encounter for closed fracture: Secondary | ICD-10-CM | POA: Diagnosis present

## 2016-08-23 DIAGNOSIS — S2241XD Multiple fractures of ribs, right side, subsequent encounter for fracture with routine healing: Secondary | ICD-10-CM

## 2016-08-23 DIAGNOSIS — S2231XA Fracture of one rib, right side, initial encounter for closed fracture: Secondary | ICD-10-CM | POA: Diagnosis not present

## 2016-08-23 DIAGNOSIS — Z90710 Acquired absence of both cervix and uterus: Secondary | ICD-10-CM | POA: Diagnosis not present

## 2016-08-23 DIAGNOSIS — Z79899 Other long term (current) drug therapy: Secondary | ICD-10-CM | POA: Diagnosis not present

## 2016-08-23 DIAGNOSIS — W1839XA Other fall on same level, initial encounter: Secondary | ICD-10-CM | POA: Diagnosis not present

## 2016-08-23 DIAGNOSIS — T148XXA Other injury of unspecified body region, initial encounter: Secondary | ICD-10-CM | POA: Diagnosis not present

## 2016-08-23 DIAGNOSIS — R262 Difficulty in walking, not elsewhere classified: Secondary | ICD-10-CM | POA: Diagnosis not present

## 2016-08-23 DIAGNOSIS — M5489 Other dorsalgia: Secondary | ICD-10-CM | POA: Diagnosis not present

## 2016-08-23 DIAGNOSIS — Z9181 History of falling: Secondary | ICD-10-CM | POA: Diagnosis not present

## 2016-08-23 HISTORY — DX: Disorder of thyroid, unspecified: E07.9

## 2016-08-23 LAB — CBC WITH DIFFERENTIAL/PLATELET
Basophils Absolute: 0 10*3/uL (ref 0.0–0.1)
Basophils Relative: 0 %
Eosinophils Absolute: 0.1 10*3/uL (ref 0.0–0.7)
Eosinophils Relative: 1 %
HCT: 39.1 % (ref 36.0–46.0)
Hemoglobin: 12.9 g/dL (ref 12.0–15.0)
Lymphocytes Relative: 8 %
Lymphs Abs: 0.8 10*3/uL (ref 0.7–4.0)
MCH: 29.6 pg (ref 26.0–34.0)
MCHC: 33 g/dL (ref 30.0–36.0)
MCV: 89.7 fL (ref 78.0–100.0)
Monocytes Absolute: 0.5 10*3/uL (ref 0.1–1.0)
Monocytes Relative: 5 %
Neutro Abs: 8.6 10*3/uL — ABNORMAL HIGH (ref 1.7–7.7)
Neutrophils Relative %: 86 %
Platelets: 335 10*3/uL (ref 150–400)
RBC: 4.36 MIL/uL (ref 3.87–5.11)
RDW: 14.4 % (ref 11.5–15.5)
WBC: 10 10*3/uL (ref 4.0–10.5)

## 2016-08-23 LAB — BASIC METABOLIC PANEL
Anion gap: 6 (ref 5–15)
BUN: 21 mg/dL — ABNORMAL HIGH (ref 6–20)
CO2: 28 mmol/L (ref 22–32)
Calcium: 9.7 mg/dL (ref 8.9–10.3)
Chloride: 102 mmol/L (ref 101–111)
Creatinine, Ser: 0.84 mg/dL (ref 0.44–1.00)
GFR calc Af Amer: >60 mL/min (ref >60)
GFR calc non Af Amer: >60 mL/min (ref >60)
Glucose, Bld: 103 mg/dL — ABNORMAL HIGH (ref 65–99)
Potassium: 4 mmol/L (ref 3.5–5.1)
Sodium: 136 mmol/L (ref 135–145)

## 2016-08-23 MED ORDER — FENTANYL CITRATE (PF) 100 MCG/2ML IJ SOLN
50.0000 ug | Freq: Once | INTRAMUSCULAR | Status: AC
  Administered 2016-08-23: 50 ug via INTRAVENOUS
  Filled 2016-08-23: qty 2

## 2016-08-23 MED ORDER — FENTANYL CITRATE (PF) 100 MCG/2ML IJ SOLN
12.5000 ug | Freq: Once | INTRAMUSCULAR | Status: AC
  Administered 2016-08-23: 12.5 ug via INTRAVENOUS
  Filled 2016-08-23: qty 2

## 2016-08-23 MED ORDER — MORPHINE SULFATE (PF) 4 MG/ML IV SOLN
4.0000 mg | Freq: Once | INTRAVENOUS | Status: AC
  Administered 2016-08-23: 4 mg via INTRAVENOUS
  Filled 2016-08-23: qty 1

## 2016-08-23 MED ORDER — HYDROMORPHONE HCL 1 MG/ML IJ SOLN
0.5000 mg | Freq: Once | INTRAMUSCULAR | Status: AC
  Administered 2016-08-23: 0.5 mg via INTRAVENOUS
  Filled 2016-08-23: qty 1

## 2016-08-23 MED ORDER — IOPAMIDOL (ISOVUE-300) INJECTION 61%
100.0000 mL | Freq: Once | INTRAVENOUS | Status: AC | PRN
  Administered 2016-08-23: 100 mL via INTRAVENOUS

## 2016-08-23 NOTE — ED Triage Notes (Addendum)
Per EMS, pt fell in bathroom striking R side on the toilet. Pt c/o R rib pain, no deformity noted. Denies LOC, neck or back pain. Pt had 140mcg fentanyl PTA

## 2016-08-23 NOTE — ED Notes (Signed)
Pt given ice chips per RN  

## 2016-08-23 NOTE — ED Notes (Signed)
Placed Pt. On 2 liters of Oxygen due to Pt. 89-90% on RA

## 2016-08-23 NOTE — ED Notes (Signed)
Ice pack was given to patient.  

## 2016-08-23 NOTE — ED Notes (Signed)
Vital signs stable. 

## 2016-08-23 NOTE — ED Provider Notes (Signed)
[facility] DEPT [facility] Provider Note   CSN: [id number] Arrival date & time: 08/23/16  1631  By signing my name below, I, Jeanell Sparrow, attest that this documentation has been prepared under the direction and in the presence of non-physician practitioner, Melina Schools, PA-C. Electronically Signed: Jeanell Sparrow, Scribe. 08/23/2016. 4:39 PM.  History   Chief Complaint Chief Complaint  Patient presents with  . Fall   The history is provided by the patient. No language interpreter was used.   HPI Comments: Elizabeth Bauer is a 76 y.o. female with a PMHx of Parkinson's disease who presents to the Emergency Department complaining of a fall that occurred today. She states she lost balance after tripping over her feet,  fell, and hit the right side of her ribs on the toilet. She also hit her occipital head. She denies any LOC. She took 186mcg Fentanyl PTA with relief. She reports associated intermittent moderate right-sided rib pain. Denies any shortness of breath but states that it is difficult to take a deep breath due to pain. She denies any use of anti-coagulants, nausea, vomiting, abdominal pain, SOB, neck pain, back pain, headache,Lightheadedness, dizziness, syncope, seizure-like activity or other complaints.     PCP: Henrine Screws, MD  Past Medical History:  Diagnosis Date  . Cataract   . Parkinson disease ([place])   . Thyroid disease     Patient Active Problem List   Diagnosis Date Noted  . Cognitive decline 11/09/2011  . 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 ABDOMINAL HYSTERECTOMY    . EYE SURGERY    . TONSILECTOMY, ADENOIDECTOMY, BILATERAL MYRINGOTOMY AND TUBES      OB History    No data available       Home Medications    Prior to Admission medications   Medication Sig Start Date End Date Taking? Authorizing Provider  aspirin EC 81 MG tablet Take 81 mg by mouth every evening.   Yes Historical Provider, MD    carbidopa-levodopa (SINEMET IR) 25-100 MG per tablet Take 0.5 tablets by mouth 2 (two) times daily.    Yes Historical Provider, MD  levothyroxine (SYNTHROID, LEVOTHROID) 88 MCG tablet Take 88 mcg by mouth daily. 11/25/15  Yes Historical Provider, MD  Bilberry, Vaccinium myrtillus, (BILBERRY PO) Take 1 tablet by mouth daily.    Historical Provider, MD  BIOTIN PO Take 1 tablet by mouth 2 (two) times daily.    Historical Provider, MD  cetirizine (ZYRTEC) 10 MG tablet Take 10 mg by mouth daily.    Historical Provider, MD  clonazePAM (KLONOPIN) 0.5 MG tablet Take 0.5 mg by mouth at bedtime. 01/03/16   Historical Provider, MD  diphenhydramine-acetaminophen (TYLENOL PM) 25-500 MG TABS tablet Take 2 tablets by mouth at bedtime.    Historical Provider, MD  fluticasone (FLONASE) 50 MCG/ACT nasal spray Place 1-2 sprays into both nostrils daily as needed for allergies or rhinitis.    Historical Provider, MD  Multiple Vitamin (MULTI-VITAMINS) TABS Take 1 tablet by mouth daily.    Historical Provider, MD  Probiotic CAPS Take 1 capsule by mouth daily.    Historical Provider, MD  sertraline (ZOLOFT) 50 MG tablet Take 25 mg by mouth daily. 01/04/16   Historical Provider, MD  TURMERIC PO Take 1 tablet by mouth daily.    Historical Provider, MD  VESICARE 10 MG tablet Take 5 mg by mouth at bedtime.  11/22/15   Historical Provider, MD    Family History No family  history on file.  Social History Social History  Substance Use Topics  . Smoking status: Former Research scientist (life sciences)  . Smokeless tobacco: Never Used  . Alcohol use Yes     Allergies   Patient has no known allergies.   Review of Systems Review of Systems  Respiratory: Negative for shortness of breath.   Cardiovascular: Positive for chest pain (Right rib).  Gastrointestinal: Negative for abdominal pain, nausea and vomiting.  Musculoskeletal: Negative for back pain and neck pain.  Neurological: Negative for syncope and headaches.  Hematological: Does not  bruise/bleed easily.  All other systems reviewed and are negative.    Physical Exam Updated Vital Signs BP 172/79 (BP Location: Left Arm)   Pulse 96   Temp 98.6 F (37 C) (Oral)   Resp 18   SpO2 95%   Physical Exam  Constitutional: She appears well-developed and well-nourished. No distress.  Mild distress due to pain.  HENT:  Head: Normocephalic and atraumatic. Head is without raccoon's eyes, without Battle's sign, without contusion and without laceration.  No signs of hematoma, wound, abrasion to the head.  Eyes: Conjunctivae and EOM are normal. Pupils are equal, round, and reactive to light.  Neck: Normal range of motion. Neck supple.  No midline C-spine tenderness. No deformities or step-offs noted.  Cardiovascular: Normal rate, regular rhythm, normal heart sounds and intact distal pulses.  Exam reveals no gallop and no friction rub.   No murmur heard. Pulmonary/Chest: Effort normal and breath sounds normal. No respiratory distress. She exhibits tenderness.  The patient was significant tenderness to the right upper lateral chest wall. No ecchymosis, deformity, crepitus, step-offs noted. No wound noted. Patient is not hypoxic or tachypnea. Lungs are clear to auscultation bilaterally.  Abdominal: Soft. Bowel sounds are normal. She exhibits no distension. There is no tenderness. There is no rebound and no guarding.  Musculoskeletal: Normal range of motion.  No midline L-spine or T-spine tenderness. Therefore step-offs noted. Pelvis is stable. Without any tenderness to palpation.  Neurological: She is alert.  The patient is alert, attentive, and oriented x 3. Speech is clear. Cranial nerve II-VII grossly intact. Negative pronator drift. Sensation intact. Strength 5/5 in all extremities. Reflexes 2+ and symmetric at biceps, triceps, knees, and ankles. Rapid alternating movement and fine finger movements intact. Not assessed Romberg or gait due to pain   Skin: Skin is warm and dry.  Capillary refill takes less than 2 seconds.  Psychiatric: She has a normal mood and affect.  Nursing note and vitals reviewed.    ED Treatments / Results  DIAGNOSTIC STUDIES: Oxygen Saturation is 95% on RA, normal by my interpretation.    COORDINATION OF CARE: 4:43 PM- Pt advised of plan for treatment and pt agrees.  Labs (all labs ordered are listed, but only abnormal results are displayed) Labs Reviewed - No data to display  EKG  EKG Interpretation None       Radiology Dg Ribs Unilateral W/chest Right  Result Date: 08/23/2016 CLINICAL DATA:  Right rib pain after a fall today. EXAM: RIGHT RIBS AND CHEST - 3+ VIEW COMPARISON:  Right rib series of Oct 28, 2010 FINDINGS: The lungs are well-expanded and clear. The heart and pulmonary vascularity are normal. There is no pleural effusion or pneumothorax. The mediastinum is normal in width. Right rib detail images include a BB over the symptomatic lower ribcage. There is a minimally displaced fracture of the anterior aspect of the right ninth rib. IMPRESSION: Minimally displaced fracture of the right anterior ninth  rib. No pneumothorax or significant pleural effusion. No acute cardiopulmonary abnormality. Electronically Signed   By: David  Martinique M.D.   On: 08/23/2016 17:14   Ct Head Wo Contrast  Result Date: 08/23/2016 CLINICAL DATA:  Fall in bathroom, right head injury EXAM: CT HEAD WITHOUT CONTRAST CT CERVICAL SPINE WITHOUT CONTRAST TECHNIQUE: Multidetector CT imaging of the head and cervical spine was performed following the standard protocol without intravenous contrast. Multiplanar CT image reconstructions of the cervical spine were also generated. COMPARISON:  CT head dated 01/17/2016 FINDINGS: CT HEAD FINDINGS Brain: No evidence of acute infarction, hemorrhage, hydrocephalus, extra-axial collection or mass lesion/mass effect. Subcortical white matter and periventricular small vessel ischemic changes. Mild cortical atrophy. Vascular: No  hyperdense vessel or unexpected calcification. Skull: Normal. Negative for fracture or focal lesion. Sinuses/Orbits: The visualized paranasal sinuses are essentially clear. The mastoid air cells are unopacified. Other: None. CT CERVICAL SPINE FINDINGS Alignment: Normal cervical lordosis. Skull base and vertebrae: No acute fracture. No primary bone lesion or focal pathologic process. Soft tissues and spinal canal: No prevertebral fluid or swelling. No visible canal hematoma. Disc levels:  Mild degenerative changes at C7-T1. Spinal canal is patent. Upper chest: Visualized lung apices are clear. Other: Visualized thyroid is unremarkable. IMPRESSION: No evidence of acute intracranial abnormality. Atrophy with small vessel ischemic changes. No evidence of traumatic injury to the cervical spine. Electronically Signed   By: Julian Hy M.D.   On: 08/23/2016 17:27   Ct Chest W Contrast  Result Date: 08/23/2016 CLINICAL DATA:  Status post fall, EXAM: CT CHEST WITH CONTRAST TECHNIQUE: Multidetector CT imaging of the chest was performed during intravenous contrast administration. CONTRAST:  166mL ISOVUE-300 IOPAMIDOL (ISOVUE-300) INJECTION 61% COMPARISON:  None. FINDINGS: Cardiovascular: No significant vascular findings. Normal heart size. No pericardial effusion. Mediastinum/Nodes: No enlarged mediastinal, hilar, or axillary lymph nodes. Thyroid gland, trachea, and esophagus demonstrate no significant findings. Lungs/Pleura: Lungs are clear. No pleural effusion or pneumothorax. Upper Abdomen: Small hiatal hernia. Musculoskeletal: Nondisplaced right lateral ninth rib fracture. Nondisplaced right posterior eleventh rib fracture. Mildly displaced right posterior ninth and tenth rib fracture. IMPRESSION: 1. Nondisplaced right lateral ninth rib fracture. Nondisplaced right posterior eleventh rib fracture. Mildly displaced right posterior ninth and tenth rib fracture. Electronically Signed   By: Kathreen Devoid   On:  08/23/2016 19:44   Ct Cervical Spine Wo Contrast  Result Date: 08/23/2016 CLINICAL DATA:  Fall in bathroom, right head injury EXAM: CT HEAD WITHOUT CONTRAST CT CERVICAL SPINE WITHOUT CONTRAST TECHNIQUE: Multidetector CT imaging of the head and cervical spine was performed following the standard protocol without intravenous contrast. Multiplanar CT image reconstructions of the cervical spine were also generated. COMPARISON:  CT head dated 01/17/2016 FINDINGS: CT HEAD FINDINGS Brain: No evidence of acute infarction, hemorrhage, hydrocephalus, extra-axial collection or mass lesion/mass effect. Subcortical white matter and periventricular small vessel ischemic changes. Mild cortical atrophy. Vascular: No hyperdense vessel or unexpected calcification. Skull: Normal. Negative for fracture or focal lesion. Sinuses/Orbits: The visualized paranasal sinuses are essentially clear. The mastoid air cells are unopacified. Other: None. CT CERVICAL SPINE FINDINGS Alignment: Normal cervical lordosis. Skull base and vertebrae: No acute fracture. No primary bone lesion or focal pathologic process. Soft tissues and spinal canal: No prevertebral fluid or swelling. No visible canal hematoma. Disc levels:  Mild degenerative changes at C7-T1. Spinal canal is patent. Upper chest: Visualized lung apices are clear. Other: Visualized thyroid is unremarkable. IMPRESSION: No evidence of acute intracranial abnormality. Atrophy with small vessel ischemic changes. No evidence  of traumatic injury to the cervical spine. Electronically Signed   By: Julian Hy M.D.   On: 08/23/2016 17:27    Procedures Procedures (including critical care time)  Medications Ordered in ED Medications  oxyCODONE (Oxy IR/ROXICODONE) immediate release tablet 5 mg (5 mg Oral Given 08/24/16 1819)  morphine 2 MG/ML injection 2-4 mg (2 mg Intravenous Given 08/24/16 0634)  enoxaparin (LOVENOX) injection 40 mg (40 mg Subcutaneous Given 08/24/16 0936)  ondansetron  (ZOFRAN) tablet 4 mg (not administered)    Or  ondansetron (ZOFRAN) injection 4 mg (not administered)  methocarbamol (ROBAXIN) tablet 500 mg (500 mg Oral Given 08/24/16 1819)  ibuprofen (ADVIL,MOTRIN) tablet 600 mg (600 mg Oral Given 08/24/16 1253)  acetaminophen (TYLENOL) tablet 500 mg (500 mg Oral Given 08/25/16 0548)  fentaNYL (SUBLIMAZE) injection 12.5 mcg (12.5 mcg Intravenous Given 08/23/16 1734)  morphine 4 MG/ML injection 4 mg (4 mg Intravenous Given 08/23/16 1812)  iopamidol (ISOVUE-300) 61 % injection 100 mL (100 mLs Intravenous Contrast Given 08/23/16 1922)  HYDROmorphone (DILAUDID) injection 0.5 mg (0.5 mg Intravenous Given 08/23/16 2010)  fentaNYL (SUBLIMAZE) injection 50 mcg (50 mcg Intravenous Given 08/23/16 2220)     Initial Impression / Assessment and Plan / ED Course  I have reviewed the triage vital signs and the nursing notes.  Pertinent labs & imaging results that were available during my care of the patient were reviewed by me and considered in my medical decision making (see chart for details).     Patient presents to the ED with mechanical fall. Complains of right lateral chest wall pain. Lungs are clear to auscultation bilaterally. She is not hypoxic or tachypnea. Patient did hit her head but denies any LOC. Given patient's age will scan head and cervical spine. We'll obtain chest x-ray with lateral rib. CT head and neck showed no acute abnormalities. X-ray does show minimally displaced right rib fracture of the ninth rib. Patient's pain was difficult to control. Saturations decreased to approximately 89 and 90%. Given patient's increased pain discussed with Dr. Sherry Ruffing regarding ordering CAT scan of chest. Labs were obtained prior to imaging with normal creatinine. CTA chest show nondisplaced right lateral ninth rib fracture., nondisplaced right posterior eleventh rib fracture, mildly displaced right posterior ninth and tenth rib fracture. Given 4 of her fractures with difficult pain  to control we'll consult trauma surgery for likely admission. Patient does have a high risk of pneumonia rate and mortality given her age and number of fractures. Spoke with Dr. Kieth Brightly who agrees to admit patient to his service. Admission orders were placed. Patient was transferred to [facility] for admission by EMS. EMTALA was placed. She is currently hemodynamically stable in no acute distress. Pain is slightly improved with the Dilaudid. Patient reassessed prior to transfer and continues. Hemodynamically stable.  Final Clinical Impressions(s) / ED Diagnoses   Final diagnoses:  Fall, initial encounter  Closed fracture of multiple ribs of right side, initial encounter    New Prescriptions New Prescriptions   No medications on file  I personally performed the services described in this documentation, which was scribed in my presence. The recorded information has been reviewed and is accurate.     Doristine Devoid, PA-C 08/25/16 [place], MD 08/30/16 1215

## 2016-08-23 NOTE — ED Notes (Signed)
Patient transported to CT 

## 2016-08-23 NOTE — ED Notes (Signed)
Family at bedside. 

## 2016-08-24 DIAGNOSIS — S2241XA Multiple fractures of ribs, right side, initial encounter for closed fracture: Secondary | ICD-10-CM | POA: Diagnosis not present

## 2016-08-24 DIAGNOSIS — S2241XD Multiple fractures of ribs, right side, subsequent encounter for fracture with routine healing: Secondary | ICD-10-CM

## 2016-08-24 LAB — CBC
HCT: 40.3 % (ref 36.0–46.0)
Hemoglobin: 12.9 g/dL (ref 12.0–15.0)
MCH: 28.9 pg (ref 26.0–34.0)
MCHC: 32 g/dL (ref 30.0–36.0)
MCV: 90.2 fL (ref 78.0–100.0)
Platelets: 355 10*3/uL (ref 150–400)
RBC: 4.47 MIL/uL (ref 3.87–5.11)
RDW: 14.6 % (ref 11.5–15.5)
WBC: 10.6 10*3/uL — ABNORMAL HIGH (ref 4.0–10.5)

## 2016-08-24 LAB — CREATININE, SERUM
Creatinine, Ser: 0.79 mg/dL (ref 0.44–1.00)
GFR calc Af Amer: >60 mL/min (ref >60)
GFR calc non Af Amer: >60 mL/min (ref >60)

## 2016-08-24 MED ORDER — IBUPROFEN 200 MG PO TABS
600.0000 mg | ORAL_TABLET | Freq: Four times a day (QID) | ORAL | Status: DC | PRN
  Administered 2016-08-24: 600 mg via ORAL
  Filled 2016-08-24 (×2): qty 3

## 2016-08-24 MED ORDER — MORPHINE SULFATE (PF) 2 MG/ML IV SOLN
2.0000 mg | INTRAVENOUS | Status: DC | PRN
  Administered 2016-08-24 (×2): 2 mg via INTRAVENOUS
  Filled 2016-08-24 (×2): qty 1

## 2016-08-24 MED ORDER — ENOXAPARIN SODIUM 40 MG/0.4ML ~~LOC~~ SOLN
40.0000 mg | Freq: Every day | SUBCUTANEOUS | Status: DC
  Administered 2016-08-24 – 2016-08-25 (×2): 40 mg via SUBCUTANEOUS
  Filled 2016-08-24 (×2): qty 0.4

## 2016-08-24 MED ORDER — ACETAMINOPHEN 325 MG PO TABS
650.0000 mg | ORAL_TABLET | ORAL | Status: DC | PRN
  Administered 2016-08-24: 650 mg via ORAL
  Filled 2016-08-24: qty 2

## 2016-08-24 MED ORDER — OXYCODONE HCL 5 MG PO TABS
5.0000 mg | ORAL_TABLET | ORAL | Status: DC | PRN
  Administered 2016-08-24 (×2): 5 mg via ORAL
  Filled 2016-08-24 (×2): qty 1

## 2016-08-24 MED ORDER — OXYCODONE HCL 5 MG PO TABS: 5.0000 mg | ORAL_TABLET | ORAL | 0 refills | Status: DC | PRN

## 2016-08-24 MED ORDER — ACETAMINOPHEN 325 MG PO TABS: 650.0000 mg | ORAL_TABLET | ORAL | Status: DC | PRN

## 2016-08-24 MED ORDER — METHOCARBAMOL 500 MG PO TABS
500.0000 mg | ORAL_TABLET | Freq: Four times a day (QID) | ORAL | Status: DC | PRN
  Administered 2016-08-24 (×2): 500 mg via ORAL
  Filled 2016-08-24 (×2): qty 1

## 2016-08-24 MED ORDER — ACETAMINOPHEN 500 MG PO TABS
500.0000 mg | ORAL_TABLET | ORAL | Status: DC
Start: 2016-08-24 — End: 2016-08-25
  Administered 2016-08-24 – 2016-08-25 (×5): 500 mg via ORAL
  Filled 2016-08-24 (×5): qty 1

## 2016-08-24 MED ORDER — IBUPROFEN 600 MG PO TABS: 600.0000 mg | ORAL_TABLET | Freq: Four times a day (QID) | ORAL | 0 refills | Status: AC | PRN

## 2016-08-24 MED ORDER — ONDANSETRON HCL 4 MG PO TABS: 4.0000 mg | ORAL_TABLET | Freq: Four times a day (QID) | ORAL | Status: DC | PRN

## 2016-08-24 MED ORDER — ONDANSETRON HCL 4 MG/2ML IJ SOLN
4.0000 mg | Freq: Four times a day (QID) | INTRAMUSCULAR | Status: DC | PRN
Start: 2016-08-24 — End: 2016-08-25

## 2016-08-24 NOTE — Care Management Note (Signed)
Case Management Note  Patient Details  Name: Elizabeth Bauer MRN: [id number] Date of Birth: 1941/06/01  Subjective/Objective:  Pt admitted on 08/23/16 s/p fall with multiple Rt rib fractures.  PTA, pt independent, lives with spouse.                    Action/Plan: PT recommending home with HHPT.  Met with pt to discuss discharge arrangements. She is agreeable to [facility] services, and prefers [facility] for [facility] follow up.  Referral to Santiago Glad with [facility] for HHPT; start of care 24-48h post dc date.  Pt states husband works; daughter lives out of town but is likely coming in from out of town to assist her.    Expected Discharge Date:                  Expected Discharge Plan:  [place]  In-House Referral:     Discharge planning Services  CM Consult  Post Acute Care Choice:  Home Health Choice offered to:  Patient  DME Arranged:    DME Agency:     HH Arranged:  PT [place]:  [place]  Status of Service:  Completed, signed off  If discussed at [place] of Stay Meetings, dates discussed:    Additional Comments:  Reinaldo Raddle, RN, BSN  Trauma/Neuro ICU Case Manager [phone]

## 2016-08-24 NOTE — Progress Notes (Signed)
[facility] Progress Note     Subjective: Laying in bed, no acute distress. Reports a history of multiple falls 2/2 freezing gate/parkinsonian features. Denies a history of injury/fracture secondary to previous falls. Tolerating PO. Denies nausea/vomiting.  Patient has a lot of assistive devices at home for ambulating - cane, 2 walkers, transport chair   Objective: Vital signs in last 24 hours: Temp:  [98.2 F (36.8 C)-98.6 F (37 C)] 98.3 F (36.8 C) (03/02 0431) Pulse Rate:  [69-96] 70 (03/02 0431) Resp:  [16-21] 16 (03/02 0431) BP: (152-172)/(65-88) 153/65 (03/02 0431) SpO2:  [95 %-100 %] 99 % (03/02 0431) Last BM Date: 08/23/16  Intake/Output from previous day: 03/01 0701 - 03/02 0700 In: -  Out: 2 [Urine:2] Intake/Output this shift: Total I/O In: 240 [P.O.:240] Out: -   PE: Gen:  Alert, NAD, pleasant and cooperatve Card:  RRR,  Pulm:  Non-labored, CTAB with diminished breath sounds in bilateral bases - 750 cc on IS Abd: Soft, NT/ND, +BS  Lab Results:   Recent Labs  08/23/16 1820 08/24/16 0410  WBC 10.0 10.6*  HGB 12.9 12.9  HCT 39.1 40.3  PLT 335 355   BMET  Recent Labs  08/23/16 1820 08/24/16 0410  NA 136  --   K 4.0  --   CL 102  --   CO2 28  --   GLUCOSE 103*  --   BUN 21*  --   CREATININE 0.84 0.79  CALCIUM 9.7  --    PT/INR No results for input(s): LABPROT, INR in the last 72 hours. CMP     Component Value Date/Time   NA 136 08/23/2016 1820   K 4.0 08/23/2016 1820   CL 102 08/23/2016 1820   CO2 28 08/23/2016 1820   GLUCOSE 103 (H) 08/23/2016 1820   BUN 21 (H) 08/23/2016 1820   CREATININE 0.79 08/24/2016 0410   CALCIUM 9.7 08/23/2016 1820   GFRNONAA >60 08/24/2016 0410   GFRAA >60 08/24/2016 0410   Lipase  No results found for: LIPASE     Studies/Results: Dg Ribs Unilateral W/chest Right  Result Date: 08/23/2016 CLINICAL DATA:  Right rib pain after a fall today. EXAM: RIGHT RIBS AND CHEST - 3+ VIEW COMPARISON:   Right rib series of Oct 28, 2010 FINDINGS: The lungs are well-expanded and clear. The heart and pulmonary vascularity are normal. There is no pleural effusion or pneumothorax. The mediastinum is normal in width. Right rib detail images include a BB over the symptomatic lower ribcage. There is a minimally displaced fracture of the anterior aspect of the right ninth rib. IMPRESSION: Minimally displaced fracture of the right anterior ninth rib. No pneumothorax or significant pleural effusion. No acute cardiopulmonary abnormality. Electronically Signed   By: David  Martinique M.D.   On: 08/23/2016 17:14   Ct Head Wo Contrast  Result Date: 08/23/2016 CLINICAL DATA:  Fall in bathroom, right head injury EXAM: CT HEAD WITHOUT CONTRAST CT CERVICAL SPINE WITHOUT CONTRAST TECHNIQUE: Multidetector CT imaging of the head and cervical spine was performed following the standard protocol without intravenous contrast. Multiplanar CT image reconstructions of the cervical spine were also generated. COMPARISON:  CT head dated 01/17/2016 FINDINGS: CT HEAD FINDINGS Brain: No evidence of acute infarction, hemorrhage, hydrocephalus, extra-axial collection or mass lesion/mass effect. Subcortical white matter and periventricular small vessel ischemic changes. Mild cortical atrophy. Vascular: No hyperdense vessel or unexpected calcification. Skull: Normal. Negative for fracture or focal lesion. Sinuses/Orbits: The visualized paranasal sinuses are essentially clear. The mastoid  air cells are unopacified. Other: None. CT CERVICAL SPINE FINDINGS Alignment: Normal cervical lordosis. Skull base and vertebrae: No acute fracture. No primary bone lesion or focal pathologic process. Soft tissues and spinal canal: No prevertebral fluid or swelling. No visible canal hematoma. Disc levels:  Mild degenerative changes at C7-T1. Spinal canal is patent. Upper chest: Visualized lung apices are clear. Other: Visualized thyroid is unremarkable. IMPRESSION: No  evidence of acute intracranial abnormality. Atrophy with small vessel ischemic changes. No evidence of traumatic injury to the cervical spine. Electronically Signed   By: Julian Hy M.D.   On: 08/23/2016 17:27   Ct Chest W Contrast  Result Date: 08/23/2016 CLINICAL DATA:  Status post fall, EXAM: CT CHEST WITH CONTRAST TECHNIQUE: Multidetector CT imaging of the chest was performed during intravenous contrast administration. CONTRAST:  133mL ISOVUE-300 IOPAMIDOL (ISOVUE-300) INJECTION 61% COMPARISON:  None. FINDINGS: Cardiovascular: No significant vascular findings. Normal heart size. No pericardial effusion. Mediastinum/Nodes: No enlarged mediastinal, hilar, or axillary lymph nodes. Thyroid gland, trachea, and esophagus demonstrate no significant findings. Lungs/Pleura: Lungs are clear. No pleural effusion or pneumothorax. Upper Abdomen: Small hiatal hernia. Musculoskeletal: Nondisplaced right lateral ninth rib fracture. Nondisplaced right posterior eleventh rib fracture. Mildly displaced right posterior ninth and tenth rib fracture. IMPRESSION: 1. Nondisplaced right lateral ninth rib fracture. Nondisplaced right posterior eleventh rib fracture. Mildly displaced right posterior ninth and tenth rib fracture. Electronically Signed   By: Kathreen Devoid   On: 08/23/2016 19:44   Ct Cervical Spine Wo Contrast  Result Date: 08/23/2016 CLINICAL DATA:  Fall in bathroom, right head injury EXAM: CT HEAD WITHOUT CONTRAST CT CERVICAL SPINE WITHOUT CONTRAST TECHNIQUE: Multidetector CT imaging of the head and cervical spine was performed following the standard protocol without intravenous contrast. Multiplanar CT image reconstructions of the cervical spine were also generated. COMPARISON:  CT head dated 01/17/2016 FINDINGS: CT HEAD FINDINGS Brain: No evidence of acute infarction, hemorrhage, hydrocephalus, extra-axial collection or mass lesion/mass effect. Subcortical white matter and periventricular small vessel  ischemic changes. Mild cortical atrophy. Vascular: No hyperdense vessel or unexpected calcification. Skull: Normal. Negative for fracture or focal lesion. Sinuses/Orbits: The visualized paranasal sinuses are essentially clear. The mastoid air cells are unopacified. Other: None. CT CERVICAL SPINE FINDINGS Alignment: Normal cervical lordosis. Skull base and vertebrae: No acute fracture. No primary bone lesion or focal pathologic process. Soft tissues and spinal canal: No prevertebral fluid or swelling. No visible canal hematoma. Disc levels:  Mild degenerative changes at C7-T1. Spinal canal is patent. Upper chest: Visualized lung apices are clear. Other: Visualized thyroid is unremarkable. IMPRESSION: No evidence of acute intracranial abnormality. Atrophy with small vessel ischemic changes. No evidence of traumatic injury to the cervical spine. Electronically Signed   By: Julian Hy M.D.   On: 08/23/2016 17:27    Anti-infectives: Anti-infectives    None      Assessment/Plan Fall  Right rib fractures (9, 9, 10, 11)  - advance diet  - multimodal pain control - IS/Pulm toilet - PT eval  -possible PM discharge    LOS: 0 days    Jill Alexanders , [facility] 08/24/2016, 10:56 AM Pager: [phone] Consults: [phone] Mon-Fri 7:00 am-4:30 pm Sat-Sun 7:00 am-11:30 am

## 2016-08-24 NOTE — H&P (Signed)
Activation and Reason: consult, fall from standing height  Primary Survey: airway intact, nonlabored breathing b/l, pulses intact  Elizabeth Bauer is an 76 y.o. female.  HPI: 76 yo female was in the bathroom and turned around to fast and hit her chest against the toilet on the way down. She denies loss of consciousness. She hurts in her right chest. She does not hurt in her abdomen, legs, head, or left chest. She denies nausea or vomiting.   Past Medical History:  Diagnosis Date  . Cataract   . Parkinson disease ([place])   . Thyroid disease     Past Surgical History:  Procedure Laterality Date  . ABDOMINAL HYSTERECTOMY    . EYE SURGERY    . TONSILECTOMY, ADENOIDECTOMY, BILATERAL MYRINGOTOMY AND TUBES      No family history on file.  Social History:  reports that she has quit smoking. She has never used smokeless tobacco. She reports that she drinks alcohol. She reports that she does not use drugs.  Allergies: No Known Allergies  Medications: I have reviewed the patient's current medications.  Results for orders placed or performed during the hospital encounter of 08/23/16 (from the past 48 hour(s))  Basic metabolic panel     Status: Abnormal   Collection Time: 08/23/16  6:20 PM  Result Value Ref Range   Sodium 136 135 - 145 mmol/L   Potassium 4.0 3.5 - 5.1 mmol/L   Chloride 102 101 - 111 mmol/L   CO2 28 22 - 32 mmol/L   Glucose, Bld 103 (H) 65 - 99 mg/dL   BUN 21 (H) 6 - 20 mg/dL   Creatinine, Ser 0.84 0.44 - 1.00 mg/dL   Calcium 9.7 8.9 - 10.3 mg/dL   GFR calc non Af Amer >60 >60 mL/min   GFR calc Af Amer >60 >60 mL/min    Comment: (NOTE) The eGFR has been calculated using the CKD EPI equation. This calculation has not been validated in all clinical situations. eGFR's persistently <60 mL/min signify possible Chronic Kidney Disease.    Anion gap 6 5 - 15  CBC with Differential     Status: Abnormal   Collection Time: 08/23/16  6:20 PM  Result Value Ref Range   WBC  10.0 4.0 - 10.5 K/uL   RBC 4.36 3.87 - 5.11 MIL/uL   Hemoglobin 12.9 12.0 - 15.0 g/dL   HCT 39.1 36.0 - 46.0 %   MCV 89.7 78.0 - 100.0 fL   MCH 29.6 26.0 - 34.0 pg   MCHC 33.0 30.0 - 36.0 g/dL   RDW 14.4 11.5 - 15.5 %   Platelets 335 150 - 400 K/uL   Neutrophils Relative % 86 %   Neutro Abs 8.6 (H) 1.7 - 7.7 K/uL   Lymphocytes Relative 8 %   Lymphs Abs 0.8 0.7 - 4.0 K/uL   Monocytes Relative 5 %   Monocytes Absolute 0.5 0.1 - 1.0 K/uL   Eosinophils Relative 1 %   Eosinophils Absolute 0.1 0.0 - 0.7 K/uL   Basophils Relative 0 %   Basophils Absolute 0.0 0.0 - 0.1 K/uL    Dg Ribs Unilateral W/chest Right  Result Date: 08/23/2016 CLINICAL DATA:  Right rib pain after a fall today. EXAM: RIGHT RIBS AND CHEST - 3+ VIEW COMPARISON:  Right rib series of Oct 28, 2010 FINDINGS: The lungs are well-expanded and clear. The heart and pulmonary vascularity are normal. There is no pleural effusion or pneumothorax. The mediastinum is normal in width. Right rib  detail images include a BB over the symptomatic lower ribcage. There is a minimally displaced fracture of the anterior aspect of the right ninth rib. IMPRESSION: Minimally displaced fracture of the right anterior ninth rib. No pneumothorax or significant pleural effusion. No acute cardiopulmonary abnormality. Electronically Signed   By: David  [place] M.D.   On: 08/23/2016 17:14   Ct Head Wo Contrast  Result Date: 08/23/2016 CLINICAL DATA:  Fall in bathroom, right head injury EXAM: CT HEAD WITHOUT CONTRAST CT CERVICAL SPINE WITHOUT CONTRAST TECHNIQUE: Multidetector CT imaging of the head and cervical spine was performed following the standard protocol without intravenous contrast. Multiplanar CT image reconstructions of the cervical spine were also generated. COMPARISON:  CT head dated 01/17/2016 FINDINGS: CT HEAD FINDINGS Brain: No evidence of acute infarction, hemorrhage, hydrocephalus, extra-axial collection or mass lesion/mass effect. Subcortical  white matter and periventricular small vessel ischemic changes. Mild cortical atrophy. Vascular: No hyperdense vessel or unexpected calcification. Skull: Normal. Negative for fracture or focal lesion. Sinuses/Orbits: The visualized paranasal sinuses are essentially clear. The mastoid air cells are unopacified. Other: None. CT CERVICAL SPINE FINDINGS Alignment: Normal cervical lordosis. Skull base and vertebrae: No acute fracture. No primary bone lesion or focal pathologic process. Soft tissues and spinal canal: No prevertebral fluid or swelling. No visible canal hematoma. Disc levels:  Mild degenerative changes at C7-T1. Spinal canal is patent. Upper chest: Visualized lung apices are clear. Other: Visualized thyroid is unremarkable. IMPRESSION: No evidence of acute intracranial abnormality. Atrophy with small vessel ischemic changes. No evidence of traumatic injury to the cervical spine. Electronically Signed   By: Charline Bills M.D.   On: 08/23/2016 17:27   Ct Chest W Contrast  Result Date: 08/23/2016 CLINICAL DATA:  Status post fall, EXAM: CT CHEST WITH CONTRAST TECHNIQUE: Multidetector CT imaging of the chest was performed during intravenous contrast administration. CONTRAST:  ISOVUE-300 IOPAMIDOL (ISOVUE-300) INJECTION 61% COMPARISON:  None. FINDINGS: Cardiovascular: No significant vascular findings. Normal heart size. No pericardial effusion. Mediastinum/Nodes: No enlarged mediastinal, hilar, or axillary lymph nodes. Thyroid gland, trachea, and esophagus demonstrate no significant findings. Lungs/Pleura: Lungs are clear. No pleural effusion or pneumothorax. Upper Abdomen: Small hiatal hernia. Musculoskeletal: Nondisplaced right lateral ninth rib fracture. Nondisplaced right posterior eleventh rib fracture. Mildly displaced right posterior ninth and tenth rib fracture. IMPRESSION: 1. Nondisplaced right lateral ninth rib fracture. Nondisplaced right posterior eleventh rib fracture. Mildly displaced  right posterior ninth and tenth rib fracture. Electronically Signed   By: Elige Ko   On: 08/23/2016 19:44   Ct Cervical Spine Wo Contrast  Result Date: 08/23/2016 CLINICAL DATA:  Fall in bathroom, right head injury EXAM: CT HEAD WITHOUT CONTRAST CT CERVICAL SPINE WITHOUT CONTRAST TECHNIQUE: Multidetector CT imaging of the head and cervical spine was performed following the standard protocol without intravenous contrast. Multiplanar CT image reconstructions of the cervical spine were also generated. COMPARISON:  CT head dated 01/17/2016 FINDINGS: CT HEAD FINDINGS Brain: No evidence of acute infarction, hemorrhage, hydrocephalus, extra-axial collection or mass lesion/mass effect. Subcortical white matter and periventricular small vessel ischemic changes. Mild cortical atrophy. Vascular: No hyperdense vessel or unexpected calcification. Skull: Normal. Negative for fracture or focal lesion. Sinuses/Orbits: The visualized paranasal sinuses are essentially clear. The mastoid air cells are unopacified. Other: None. CT CERVICAL SPINE FINDINGS Alignment: Normal cervical lordosis. Skull base and vertebrae: No acute fracture. No primary bone lesion or focal pathologic process. Soft tissues and spinal canal: No prevertebral fluid or swelling. No visible canal hematoma. Disc levels:  Mild degenerative  changes at C7-T1. Spinal canal is patent. Upper chest: Visualized lung apices are clear. Other: Visualized thyroid is unremarkable. IMPRESSION: No evidence of acute intracranial abnormality. Atrophy with small vessel ischemic changes. No evidence of traumatic injury to the cervical spine. Electronically Signed   By: Julian Hy M.D.   On: 08/23/2016 17:27    Review of Systems  Constitutional: Negative for chills and fever.  HENT: Negative for hearing loss.   Eyes: Negative for blurred vision and double vision.  Respiratory: Negative for cough and hemoptysis.   Cardiovascular: Positive for chest pain. Negative  for palpitations.  Gastrointestinal: Negative for abdominal pain, nausea and vomiting.  Genitourinary: Negative for dysuria and urgency.  Musculoskeletal: Negative for myalgias and neck pain.  Skin: Negative for itching and rash.  Neurological: Negative for dizziness, tingling and headaches.  Endo/Heme/Allergies: Does not bruise/bleed easily.  Psychiatric/Behavioral: Negative for depression and suicidal ideas.   Blood pressure (!) 155/69, pulse 69, temperature 98.2 F (36.8 C), temperature source Oral, resp. rate 16, SpO2 99 %. Physical Exam  Vitals reviewed. Constitutional: She is oriented to person, place, and time. She appears well-developed and well-nourished.  HENT:  Head: Normocephalic and atraumatic. Not microcephalic. Head is without raccoon's eyes, without abrasion and without contusion.  Right Ear: No drainage or swelling. No foreign bodies.  Left Ear: No drainage or swelling. No foreign bodies.  Nose: No mucosal edema, rhinorrhea or nose lacerations.  Mouth/Throat: Oropharynx is clear and moist and mucous membranes are normal.  Eyes: Conjunctivae and EOM are normal. Pupils are equal, round, and reactive to light. Right eye exhibits no discharge. Left eye exhibits no discharge.  Neck: Normal range of motion. Neck supple.  Cardiovascular: Normal rate and regular rhythm.   Pulses:      Carotid pulses are 2+ on the right side, and 2+ on the left side.      Radial pulses are 2+ on the right side, and 2+ on the left side.       Dorsalis pedis pulses are 2+ on the right side, and 2+ on the left side.  Respiratory: Effort normal and breath sounds normal. No apnea. She has no decreased breath sounds. She has no wheezes. She has no rhonchi. She has no rales.  GI: Soft. Bowel sounds are normal. She exhibits no shifting dullness and no distension. There is no tenderness. There is no rigidity, no guarding, no tenderness at McBurney's point and negative Murphy's sign.  Musculoskeletal:  Normal range of motion.  Neurological: She is alert and oriented to person, place, and time. She has normal strength. No cranial nerve deficit or sensory deficit. GCS eye subscore is 4. GCS verbal subscore is 5. GCS motor subscore is 6.  Skin: Skin is warm and dry.  Psychiatric: She has a normal mood and affect. Her speech is normal and behavior is normal. Thought content normal.      Assessment/Plan: 76 yo female with fall from standing height with 4 rib fractures on the right (9, 9, 10, 11). Pain control marginal in first hours and so she has been transferred to the Trauma center for pain control -clear liquid diet -multimodal pain medications -resp care -up to ambulate with assist in am  Procedures: none  Arta Bruce Ricketta Colantonio 08/24/2016, 1:24 AM

## 2016-08-24 NOTE — Plan of Care (Signed)
Problem: Pain Managment: Goal: General experience of comfort will improve Outcome: Progressing Denies needs for pain meds.  Problem: Skin Integrity: Goal: Risk for impaired skin integrity will decrease Outcome: Progressing Bruises noted to right flank, no other skin issues noted  Problem: Activity: Goal: Risk for activity intolerance will decrease Outcome: Progressing Tolerates turning and repositioning well  Problem: Bowel/Gastric: Goal: Will not experience complications related to bowel motility Outcome: Progressing Denies bowel issues

## 2016-08-24 NOTE — Evaluation (Signed)
Physical Therapy Evaluation Patient Details Name: Elizabeth Bauer MRN: [id number] DOB: 1940-11-15 Today's Date: 08/24/2016   History of Present Illness  Pt is a 76 yo female admitted through the ED on 08/23/16 following a fall striking her commode and suffering 4 right rib fractures. PMH significant for Parkinson's disease, cataract and thyroid disease.   Clinical Impression  Pt presents with the above diagnosis and below deficits for PT evaluation. Prior to admission, pt received assistance from her husband for adl's and iadls including transportation to MD appointments and for shopping. Pt reports performing short distance gait in home and using motorized cart when going out to stores. Pt will benefit from continued skilled PT services in order to maximize her functional outcomes acutely and once she returns home.     Follow Up Recommendations Home health PT;Supervision for mobility/OOB    Equipment Recommendations  Rolling walker with 5" wheels    Recommendations for Other Services       Precautions / Restrictions Precautions Precautions: Fall Precaution Comments: Fall lead to recent hospitalization Restrictions Weight Bearing Restrictions: No      Mobility  Bed Mobility Overal bed mobility: Needs Assistance Bed Mobility: Supine to Sit     Supine to sit: Min assist;HOB elevated     General bed mobility comments: Min A to bring LE's off pain and lift trunk upright due to pain  Transfers Overall transfer level: Needs assistance Equipment used: Rolling walker (2 wheeled) Transfers: Sit to/from Stand Sit to Stand: Min assist         General transfer comment: Min A from EOB with increased time required to stand.   Ambulation/Gait Ambulation/Gait assistance: Min assist Ambulation Distance (Feet): 30 Feet Assistive device: Rolling walker (2 wheeled) Gait Pattern/deviations: Step-through pattern;Festinating;Narrow base of support;Trunk flexed Gait velocity: decreased Gait  velocity interpretation: Below normal speed for age/gender General Gait Details: Cues for taking larger steps which improves cadence and step length. Pt presents with parkinson's like gait and sequencing.   Stairs            Wheelchair Mobility    Modified Rankin (Stroke Patients Only)       Balance Overall balance assessment: Needs assistance Sitting-balance support: No upper extremity supported;Feet supported Sitting balance-Leahy Scale: Fair     Standing balance support: Bilateral upper extremity supported Standing balance-Leahy Scale: Poor Standing balance comment: Relies on RW for mobility                             Pertinent Vitals/Pain Pain Assessment: Faces Faces Pain Scale: Hurts little more Pain Location: right flank with mobility Pain Descriptors / Indicators: Grimacing;Guarding;Moaning Pain Intervention(s): Monitored during session;Premedicated before session;Repositioned;Ice applied    Home Living Family/patient expects to be discharged to:: Private residence Living Arrangements: Spouse/significant other Available Help at Discharge: Family;Available PRN/intermittently Type of Home: House Home Access: Stairs to enter Entrance Stairs-Rails: None Entrance Stairs-Number of Steps: 1 Home Layout: Two level;Able to live on main level with bedroom/bathroom Home Equipment: Walker - 4 wheels;Toilet riser;Other (comment) (three wheeled walker)      Prior Function Level of Independence: Needs assistance   Gait / Transfers Assistance Needed: using a walker occasionally for short distance and uses motorized cart in stores  ADL's / Homemaking Assistance Needed: husband helps dress lower body occasionally        Hand Dominance   Dominant Hand: Right    Extremity/Trunk Assessment   Upper Extremity Assessment Upper Extremity Assessment:  Overall [facility] for tasks assessed    Lower Extremity Assessment Lower Extremity Assessment: Generalized  weakness       Communication   Communication: No difficulties;Other (comment) (soft spoken)  Cognition Arousal/Alertness: Awake/alert Behavior During Therapy: WFL for tasks assessed/performed Overall Cognitive Status: Within Functional Limits for tasks assessed                      General Comments      Exercises     Assessment/Plan    PT Assessment Patient needs continued PT services  PT Problem List Decreased strength;Decreased activity tolerance;Decreased balance;Decreased mobility;Decreased coordination;Decreased knowledge of use of DME;Pain       PT Treatment Interventions DME instruction;Gait training;Stair training;Functional mobility training;Therapeutic activities;Therapeutic exercise;Balance training    PT Goals (Current goals can be found in the Care Plan section)  Acute Rehab PT Goals Patient Stated Goal: to have less pain PT Goal Formulation: With patient Time For Goal Achievement: 08/31/16 Potential to Achieve Goals: Good    Frequency Min 3X/week   Barriers to discharge        Co-evaluation               End of Session Equipment Utilized During Treatment: Gait belt Activity Tolerance: Patient limited by pain Patient left: in chair;with call bell/phone within reach Nurse Communication: Mobility status PT Visit Diagnosis: Other abnormalities of gait and mobility (R26.89);History of falling (Z91.81)    Functional Assessment Tool Used: AM-PAC 6 Clicks Basic Mobility;Clinical judgement Functional Limitation: Mobility: Walking and moving around Mobility: Walking and Moving Around Current Status [id number]): At least 40 percent but less than 60 percent impaired, limited or restricted Mobility: Walking and Moving Around Goal Status [phone]): At least 1 percent but less than 20 percent impaired, limited or restricted    Time: [id number] PT Time Calculation (min) (ACUTE ONLY): 33 min   Charges:   PT Evaluation $PT Eval Moderate Complexity: 1  Procedure PT Treatments $Gait Training: 8-22 mins   PT G Codes:   PT G-Codes **NOT FOR INPATIENT CLASS** Functional Assessment Tool Used: AM-PAC 6 Clicks Basic Mobility;Clinical judgement Functional Limitation: Mobility: Walking and moving around Mobility: Walking and Moving Around Current Status [id number]): At least 40 percent but less than 60 percent impaired, limited or restricted Mobility: Walking and Moving Around Goal Status [phone]): At least 1 percent but less than 20 percent impaired, limited or restricted     Scheryl Marten PT, DPT  [phone]  08/24/2016, 12:54 PM

## 2016-08-24 NOTE — Progress Notes (Signed)
Checked on patient this afternoon. Still in pain and receiving IV morphine. Encourage PO pain control with tylenol, ibuprofen, and oxy. home with [facility] tomorrow.  Obie Dredge, PA-C [facility] Pager: [phone] Consults: [phone] Mon-Fri 7:00 am-4:30 pm Sat-Sun 7:00 am-11:30 am

## 2016-08-25 DIAGNOSIS — S2241XA Multiple fractures of ribs, right side, initial encounter for closed fracture: Secondary | ICD-10-CM | POA: Diagnosis not present

## 2016-08-25 NOTE — Progress Notes (Signed)
Physical Therapy Treatment Patient Details Name: Elizabeth Bauer MRN: [id number] DOB: [birth year] Today's Date: 08/25/2016    History of Present Illness Pt is a 76 yo female admitted through the ED on 08/23/16 following a fall striking her commode and suffering 4 right rib fractures. PMH significant for Parkinson's disease, cataract and thyroid disease.     PT Comments    Pt presents with a minimal improvement in mobility this session, but continues to require assistance with bed mobs, transfers and gait. Pt will have assistance from friends and family when she returns home. Pt has LOB posteriorly with mobility requiring some correction by clinician to balance.     Follow Up Recommendations  Home health PT;Supervision for mobility/OOB     Equipment Recommendations  Rolling walker with 5" wheels    Recommendations for Other Services       Precautions / Restrictions Precautions Precautions: Fall Precaution Comments: Fall lead to recent hospitalization Restrictions Weight Bearing Restrictions: No    Mobility  Bed Mobility Overal bed mobility: Needs Assistance Bed Mobility: Supine to Sit     Supine to sit: Min assist;HOB elevated     General bed mobility comments: Min A to bring LE's off pain and lift trunk upright due to pain  Transfers Overall transfer level: Needs assistance Equipment used: Rolling walker (2 wheeled) Transfers: Sit to/from Stand Sit to Stand: Min assist         General transfer comment: Min A from EOB with increased time required to stand.   Ambulation/Gait Ambulation/Gait assistance: Min assist Ambulation Distance (Feet): 20 Feet Assistive device: Rolling walker (2 wheeled) Gait Pattern/deviations: Step-through pattern;Festinating;Narrow base of support;Trunk flexed Gait velocity: decreased Gait velocity interpretation: Below normal speed for age/gender General Gait Details: Cues for taking larger steps which improves cadence and step length. Pt  presents with parkinson's like gait and sequencing.    Stairs            Wheelchair Mobility    Modified Rankin (Stroke Patients Only)       Balance                                    Cognition Arousal/Alertness: Awake/alert Behavior During Therapy: WFL for tasks assessed/performed Overall Cognitive Status: Within Functional Limits for tasks assessed                      Exercises      General Comments        Pertinent Vitals/Pain Pain Assessment: Faces Faces Pain Scale: Hurts even more Pain Location: right flank with mobility Pain Descriptors / Indicators: Grimacing;Guarding;Moaning Pain Intervention(s): Monitored during session;Premedicated before session;Ice applied    Home Living                      Prior Function            PT Goals (current goals can now be found in the care plan section) Acute Rehab PT Goals Patient Stated Goal: to have less pain Progress towards PT goals: Progressing toward goals    Frequency    Min 3X/week      PT Plan Current plan remains appropriate    Co-evaluation             End of Session Equipment Utilized During Treatment: Gait belt Activity Tolerance: Patient limited by pain Patient left: in chair;with call bell/phone within reach Nurse  Communication: Mobility status PT Visit Diagnosis: Other abnormalities of gait and mobility (R26.89);History of falling (Z91.81)     Time: [id number] PT Time Calculation (min) (ACUTE ONLY): 20 min  Charges:  $Therapeutic Activity: 8-22 mins                    G Codes:       Scheryl Marten PT, DPT  [phone]  08/25/2016, 8:40 AM

## 2016-08-25 NOTE — Discharge Instructions (Signed)
Rib Fracture A rib fracture is a break or crack in one of the bones of the ribs. The ribs are like a cage that goes around your upper chest. A broken or cracked rib is often painful, but most do not cause other problems. Most rib fractures heal on their own in 1-3 months. Follow these instructions at home:  Avoid activities that cause pain to the injured area. Protect your injured area.  Slowly increase activity as told by your doctor.  Take medicine as told by your doctor.  Put ice on the injured area for the first 1-2 days after you have been treated or as told by your doctor.  Put ice in a plastic bag.  Place a towel between your skin and the bag.  Leave the ice on for 15-20 minutes at a time, every 2 hours while you are awake.  Do deep breathing as told by your doctor. You may be told to:  Take deep breaths many times a day.  Cough many times a day while hugging a pillow.  Use a device (incentive spirometer) to perform deep breathing many times a day.  Drink enough fluids to keep your pee (urine) clear or pale yellow.  Do not wear a rib belt or binder. These do not allow you to breathe deeply. Get help right away if:  You have a fever.  You have trouble breathing.  You cannot stop coughing.  You cough up thick or bloody spit (mucus).  You feel sick to your stomach (nauseous), throw up (vomit), or have belly (abdominal) pain.  Your pain gets worse and medicine does not help. This information is not intended to replace advice given to you by your health care provider. Make sure you discuss any questions you have with your health care provider. Fall Prevention in the Home Falls can cause injuries and can affect people from all age groups. There are many simple things that you can do to make your home safe and to help prevent falls. What can I do on the outside of my home?  Regularly repair the edges of walkways and driveways and fix any cracks.  Remove high doorway  thresholds.  Trim any shrubbery on the main path into your home.  Use bright outdoor lighting.  Clear walkways of debris and clutter, including tools and rocks.  Regularly check that handrails are securely fastened and in good repair. Both sides of any steps should have handrails.  Install guardrails along the edges of any raised decks or porches.  Have leaves, snow, and ice cleared regularly.  Use sand or salt on walkways during winter months.  In the garage, clean up any spills right away, including grease or oil spills. What can I do in the bathroom?  Use night lights.  Install grab bars by the toilet and in the tub and shower. Do not use towel bars as grab bars.  Use non-skid mats or decals on the floor of the tub or shower.  If you need to sit down while you are in the shower, use a plastic, non-slip stool.  Keep the floor dry. Immediately clean up any water that spills on the floor.  Remove soap buildup in the tub or shower on a regular basis.  Attach bath mats securely with double-sided non-slip rug tape.  Remove throw rugs and other tripping hazards from the floor. What can I do in the bedroom?  Use night lights.  Make sure that a bedside light is easy to  reach.  Do not use oversized bedding that drapes onto the floor.  Have a firm chair that has side arms to use for getting dressed.  Remove throw rugs and other tripping hazards from the floor. What can I do in the kitchen?  Clean up any spills right away.  Avoid walking on wet floors.  Place frequently used items in easy-to-reach places.  If you need to reach for something above you, use a sturdy step stool that has a grab bar.  Keep electrical cables out of the way.  Do not use floor polish or wax that makes floors slippery. If you have to use wax, make sure that it is non-skid floor wax.  Remove throw rugs and other tripping hazards from the floor. What can I do in the stairways?  Do not leave  any items on the stairs.  Make sure that there are handrails on both sides of the stairs. Fix handrails that are broken or loose. Make sure that handrails are as long as the stairways.  Check any carpeting to make sure that it is firmly attached to the stairs. Fix any carpet that is loose or worn.  Avoid having throw rugs at the top or bottom of stairways, or secure the rugs with carpet tape to prevent them from moving.  Make sure that you have a light switch at the top of the stairs and the bottom of the stairs. If you do not have them, have them installed. What are some other fall prevention tips?  Wear closed-toe shoes that fit well and support your feet. Wear shoes that have rubber soles or low heels.  When you use a stepladder, make sure that it is completely opened and that the sides are firmly locked. Have someone hold the ladder while you are using it. Do not climb a closed stepladder.  Add color or contrast paint or tape to grab bars and handrails in your home. Place contrasting color strips on the first and last steps.  Use mobility aids as needed, such as canes, walkers, scooters, and crutches.  Turn on lights if it is dark. Replace any light bulbs that burn out.  Set up furniture so that there are clear paths. Keep the furniture in the same spot.  Fix any uneven floor surfaces.  Choose a carpet design that does not hide the edge of steps of a stairway.  Be aware of any and all pets.  Review your medicines with your healthcare provider. Some medicines can cause dizziness or changes in blood pressure, which increase your risk of falling. Talk with your health care provider about other ways that you can decrease your risk of falls. This may include working with a physical therapist or trainer to improve your strength, balance, and endurance. This information is not intended to replace advice given to you by your health care provider. Make sure you discuss any questions you  have with your health care provider. Document Released: 06/01/2002 Document Revised: 11/08/2015 Document Reviewed: 07/16/2014 Elsevier Interactive Patient Education  2017 [place] Released: 03/20/2008 Document Revised: 11/17/2015 Document Reviewed: 08/13/2012 Elsevier Interactive Patient Education  2017 [employer].

## 2016-08-25 NOTE — Discharge Summary (Signed)
Physician Discharge Summary [facility]  Patient ID: Elizabeth Bauer MRN: [id number] DOB/AGE: [birth year] 76 y.o.  Admit date: 08/23/2016 Discharge date: 08/25/2016  Admission Diagnoses:  Fall, right rib fractures  Discharge Diagnoses:  Active Problems:   Rib fracture   Closed fracture of three ribs with routine healing, right   Discharged Condition: fair  Hospital Course: patient admitted to Trauma Service with multiple right rib fractures.  Pain controlled and pulmonary toilet instituted.  [facility] arranged for assistance on discharge.  Prepared for discharge on HD#3.  Consults: None  Treatments: pulmonary toilet, pain control  Discharge Exam: Blood pressure 136/66, pulse 64, temperature 98.4 F (36.9 C), temperature source Oral, resp. rate 16, SpO2 96 %. HEENT - clear Neck - soft Chest - clear bilaterally, mild right chest wall tenderness, no flail, no crepitance Cor - RRR Abd - soft without distension, BS active  Disposition: Home    Follow-up Information    [facility]. Call.   Why:  as needed Contact information: [place] [phone] [place] Follow up.   Why:  Physical therapist to follow up with you at home.   Contact information: [place] [address] [place], [place], [facility] Office: [phone]   Signed: Earnstine Regal 08/25/2016, 7:42 AM

## 2016-08-28 DIAGNOSIS — Z87891 Personal history of nicotine dependence: Secondary | ICD-10-CM | POA: Diagnosis not present

## 2016-08-28 DIAGNOSIS — Z7982 Long term (current) use of aspirin: Secondary | ICD-10-CM | POA: Diagnosis not present

## 2016-08-28 DIAGNOSIS — Z9181 History of falling: Secondary | ICD-10-CM | POA: Diagnosis not present

## 2016-08-28 DIAGNOSIS — S2241XD Multiple fractures of ribs, right side, subsequent encounter for fracture with routine healing: Secondary | ICD-10-CM | POA: Diagnosis not present

## 2016-08-28 DIAGNOSIS — R296 Repeated falls: Secondary | ICD-10-CM | POA: Diagnosis not present

## 2016-08-28 DIAGNOSIS — G20 Parkinson's disease: Secondary | ICD-10-CM | POA: Diagnosis not present

## 2016-08-28 DIAGNOSIS — W1809XD Striking against other object with subsequent fall, subsequent encounter: Secondary | ICD-10-CM | POA: Diagnosis not present

## 2016-08-30 DIAGNOSIS — W1809XD Striking against other object with subsequent fall, subsequent encounter: Secondary | ICD-10-CM | POA: Diagnosis not present

## 2016-08-30 DIAGNOSIS — G20 Parkinson's disease: Secondary | ICD-10-CM | POA: Diagnosis not present

## 2016-08-30 DIAGNOSIS — S2241XD Multiple fractures of ribs, right side, subsequent encounter for fracture with routine healing: Secondary | ICD-10-CM | POA: Diagnosis not present

## 2016-08-30 DIAGNOSIS — Z9181 History of falling: Secondary | ICD-10-CM | POA: Diagnosis not present

## 2016-08-30 DIAGNOSIS — Z7982 Long term (current) use of aspirin: Secondary | ICD-10-CM | POA: Diagnosis not present

## 2016-08-30 DIAGNOSIS — R296 Repeated falls: Secondary | ICD-10-CM | POA: Diagnosis not present

## 2016-08-30 DIAGNOSIS — Z87891 Personal history of nicotine dependence: Secondary | ICD-10-CM | POA: Diagnosis not present

## 2016-09-03 DIAGNOSIS — W1809XD Striking against other object with subsequent fall, subsequent encounter: Secondary | ICD-10-CM | POA: Diagnosis not present

## 2016-09-03 DIAGNOSIS — R296 Repeated falls: Secondary | ICD-10-CM | POA: Diagnosis not present

## 2016-09-03 DIAGNOSIS — Z87891 Personal history of nicotine dependence: Secondary | ICD-10-CM | POA: Diagnosis not present

## 2016-09-03 DIAGNOSIS — Z7982 Long term (current) use of aspirin: Secondary | ICD-10-CM | POA: Diagnosis not present

## 2016-09-03 DIAGNOSIS — S2241XD Multiple fractures of ribs, right side, subsequent encounter for fracture with routine healing: Secondary | ICD-10-CM | POA: Diagnosis not present

## 2016-09-03 DIAGNOSIS — Z9181 History of falling: Secondary | ICD-10-CM | POA: Diagnosis not present

## 2016-09-03 DIAGNOSIS — G20 Parkinson's disease: Secondary | ICD-10-CM | POA: Diagnosis not present

## 2016-09-07 DIAGNOSIS — G20 Parkinson's disease: Secondary | ICD-10-CM | POA: Diagnosis not present

## 2016-09-07 DIAGNOSIS — Z9181 History of falling: Secondary | ICD-10-CM | POA: Diagnosis not present

## 2016-09-07 DIAGNOSIS — S2241XD Multiple fractures of ribs, right side, subsequent encounter for fracture with routine healing: Secondary | ICD-10-CM | POA: Diagnosis not present

## 2016-09-07 DIAGNOSIS — Z87891 Personal history of nicotine dependence: Secondary | ICD-10-CM | POA: Diagnosis not present

## 2016-09-07 DIAGNOSIS — R296 Repeated falls: Secondary | ICD-10-CM | POA: Diagnosis not present

## 2016-09-07 DIAGNOSIS — W1809XD Striking against other object with subsequent fall, subsequent encounter: Secondary | ICD-10-CM | POA: Diagnosis not present

## 2016-09-07 DIAGNOSIS — Z7982 Long term (current) use of aspirin: Secondary | ICD-10-CM | POA: Diagnosis not present

## 2016-09-25 DIAGNOSIS — E782 Mixed hyperlipidemia: Secondary | ICD-10-CM | POA: Diagnosis not present

## 2016-09-25 DIAGNOSIS — R300 Dysuria: Secondary | ICD-10-CM | POA: Diagnosis not present

## 2016-09-25 DIAGNOSIS — R350 Frequency of micturition: Secondary | ICD-10-CM | POA: Diagnosis not present

## 2016-09-25 DIAGNOSIS — R69 Illness, unspecified: Secondary | ICD-10-CM | POA: Diagnosis not present

## 2016-09-25 DIAGNOSIS — I10 Essential (primary) hypertension: Secondary | ICD-10-CM | POA: Diagnosis not present

## 2016-09-25 DIAGNOSIS — G231 Progressive supranuclear ophthalmoplegia [Steele-Richardson-Olszewski]: Secondary | ICD-10-CM | POA: Diagnosis not present

## 2016-09-25 DIAGNOSIS — G20 Parkinson's disease: Secondary | ICD-10-CM | POA: Diagnosis not present

## 2016-09-25 DIAGNOSIS — E039 Hypothyroidism, unspecified: Secondary | ICD-10-CM | POA: Diagnosis not present

## 2016-09-25 DIAGNOSIS — S2239XA Fracture of one rib, unspecified side, initial encounter for closed fracture: Secondary | ICD-10-CM | POA: Diagnosis not present

## 2016-09-26 DIAGNOSIS — R4182 Altered mental status, unspecified: Secondary | ICD-10-CM | POA: Diagnosis not present

## 2016-09-26 DIAGNOSIS — R269 Unspecified abnormalities of gait and mobility: Secondary | ICD-10-CM | POA: Diagnosis not present

## 2016-09-26 DIAGNOSIS — G20 Parkinson's disease: Secondary | ICD-10-CM | POA: Diagnosis not present

## 2016-09-26 DIAGNOSIS — R4189 Other symptoms and signs involving cognitive functions and awareness: Secondary | ICD-10-CM | POA: Diagnosis not present

## 2016-09-26 DIAGNOSIS — R9402 Abnormal brain scan: Secondary | ICD-10-CM | POA: Diagnosis not present

## 2016-09-26 DIAGNOSIS — D329 Benign neoplasm of meninges, unspecified: Secondary | ICD-10-CM | POA: Diagnosis not present

## 2016-09-26 DIAGNOSIS — R938 Abnormal findings on diagnostic imaging of other specified body structures: Secondary | ICD-10-CM | POA: Diagnosis not present

## 2016-09-26 DIAGNOSIS — R931 Abnormal findings on diagnostic imaging of heart and coronary circulation: Secondary | ICD-10-CM | POA: Diagnosis not present

## 2016-10-11 DIAGNOSIS — N390 Urinary tract infection, site not specified: Secondary | ICD-10-CM | POA: Diagnosis not present

## 2016-10-19 DIAGNOSIS — G939 Disorder of brain, unspecified: Secondary | ICD-10-CM | POA: Diagnosis not present

## 2016-10-19 DIAGNOSIS — D329 Benign neoplasm of meninges, unspecified: Secondary | ICD-10-CM | POA: Diagnosis not present

## 2016-10-19 DIAGNOSIS — G20 Parkinson's disease: Secondary | ICD-10-CM | POA: Diagnosis not present

## 2016-10-19 DIAGNOSIS — R9089 Other abnormal findings on diagnostic imaging of central nervous system: Secondary | ICD-10-CM | POA: Diagnosis not present

## 2016-11-20 DIAGNOSIS — D320 Benign neoplasm of cerebral meninges: Secondary | ICD-10-CM | POA: Diagnosis not present

## 2016-11-20 DIAGNOSIS — D329 Benign neoplasm of meninges, unspecified: Secondary | ICD-10-CM | POA: Diagnosis not present

## 2016-11-20 DIAGNOSIS — Z510 Encounter for antineoplastic radiation therapy: Secondary | ICD-10-CM | POA: Diagnosis not present

## 2016-11-30 DIAGNOSIS — Z79899 Other long term (current) drug therapy: Secondary | ICD-10-CM | POA: Diagnosis not present

## 2016-11-30 DIAGNOSIS — G20 Parkinson's disease: Secondary | ICD-10-CM | POA: Diagnosis not present

## 2016-11-30 DIAGNOSIS — R2689 Other abnormalities of gait and mobility: Secondary | ICD-10-CM | POA: Diagnosis not present

## 2016-11-30 DIAGNOSIS — G4752 REM sleep behavior disorder: Secondary | ICD-10-CM | POA: Diagnosis not present

## 2017-02-15 DIAGNOSIS — R350 Frequency of micturition: Secondary | ICD-10-CM | POA: Diagnosis not present

## 2017-05-10 DIAGNOSIS — G4752 REM sleep behavior disorder: Secondary | ICD-10-CM | POA: Diagnosis not present

## 2017-05-10 DIAGNOSIS — G20 Parkinson's disease: Secondary | ICD-10-CM | POA: Diagnosis not present

## 2017-05-10 DIAGNOSIS — R2689 Other abnormalities of gait and mobility: Secondary | ICD-10-CM | POA: Diagnosis not present

## 2017-05-23 DIAGNOSIS — Z79899 Other long term (current) drug therapy: Secondary | ICD-10-CM | POA: Diagnosis not present

## 2017-05-23 DIAGNOSIS — E782 Mixed hyperlipidemia: Secondary | ICD-10-CM | POA: Diagnosis not present

## 2017-05-23 DIAGNOSIS — Z0000 Encounter for general adult medical examination without abnormal findings: Secondary | ICD-10-CM | POA: Diagnosis not present

## 2017-05-23 DIAGNOSIS — G20 Parkinson's disease: Secondary | ICD-10-CM | POA: Diagnosis not present

## 2017-05-23 DIAGNOSIS — R69 Illness, unspecified: Secondary | ICD-10-CM | POA: Diagnosis not present

## 2017-05-23 DIAGNOSIS — Z23 Encounter for immunization: Secondary | ICD-10-CM | POA: Diagnosis not present

## 2017-05-23 DIAGNOSIS — Z0001 Encounter for general adult medical examination with abnormal findings: Secondary | ICD-10-CM | POA: Diagnosis not present

## 2017-05-23 DIAGNOSIS — I10 Essential (primary) hypertension: Secondary | ICD-10-CM | POA: Diagnosis not present

## 2017-05-23 DIAGNOSIS — R350 Frequency of micturition: Secondary | ICD-10-CM | POA: Diagnosis not present

## 2017-05-23 DIAGNOSIS — E039 Hypothyroidism, unspecified: Secondary | ICD-10-CM | POA: Diagnosis not present

## 2017-05-29 DIAGNOSIS — G20 Parkinson's disease: Secondary | ICD-10-CM | POA: Diagnosis not present

## 2017-05-29 DIAGNOSIS — Z923 Personal history of irradiation: Secondary | ICD-10-CM | POA: Diagnosis not present

## 2017-05-29 DIAGNOSIS — Z79899 Other long term (current) drug therapy: Secondary | ICD-10-CM | POA: Diagnosis not present

## 2017-05-29 DIAGNOSIS — D320 Benign neoplasm of cerebral meninges: Secondary | ICD-10-CM | POA: Diagnosis not present

## 2017-12-02 DIAGNOSIS — R1310 Dysphagia, unspecified: Secondary | ICD-10-CM | POA: Diagnosis not present

## 2017-12-02 DIAGNOSIS — G4752 REM sleep behavior disorder: Secondary | ICD-10-CM | POA: Diagnosis not present

## 2017-12-02 DIAGNOSIS — Z79899 Other long term (current) drug therapy: Secondary | ICD-10-CM | POA: Diagnosis not present

## 2017-12-02 DIAGNOSIS — G20 Parkinson's disease: Secondary | ICD-10-CM | POA: Diagnosis not present

## 2017-12-02 DIAGNOSIS — R2689 Other abnormalities of gait and mobility: Secondary | ICD-10-CM | POA: Diagnosis not present

## 2018-01-14 DIAGNOSIS — R1312 Dysphagia, oropharyngeal phase: Secondary | ICD-10-CM | POA: Diagnosis not present

## 2018-01-14 DIAGNOSIS — R633 Feeding difficulties: Secondary | ICD-10-CM | POA: Diagnosis not present

## 2018-01-14 DIAGNOSIS — G20 Parkinson's disease: Secondary | ICD-10-CM | POA: Diagnosis not present

## 2018-01-23 DIAGNOSIS — R1312 Dysphagia, oropharyngeal phase: Secondary | ICD-10-CM | POA: Diagnosis not present

## 2018-01-29 DIAGNOSIS — R1312 Dysphagia, oropharyngeal phase: Secondary | ICD-10-CM | POA: Diagnosis not present

## 2018-02-04 IMAGING — CT CT HEAD W/O CM
3 of 4 series · 15 of 47 positions shown, 18 images · non-contrast
Comparison: 05/07/2013; 10/28/2010

CLINICAL DATA: Increased falls since this past [REDACTED]. No loss of
consciousness. History of Parkinson's.

EXAM:
CT HEAD WITHOUT CONTRAST
TECHNIQUE: Contiguous axial images were obtained from the base of the skull
through the vertex without intravenous contrast.

[Series 2: head w/o · axial · non-contrast · 0.45mm/px · z∈[+1106,+1226]mm · 9 of 30 slices shown, 12 images]
[im 3/30  brain]
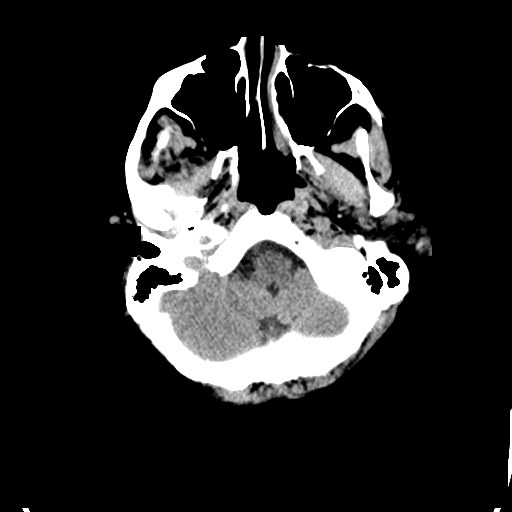
[im 3/30  bone]
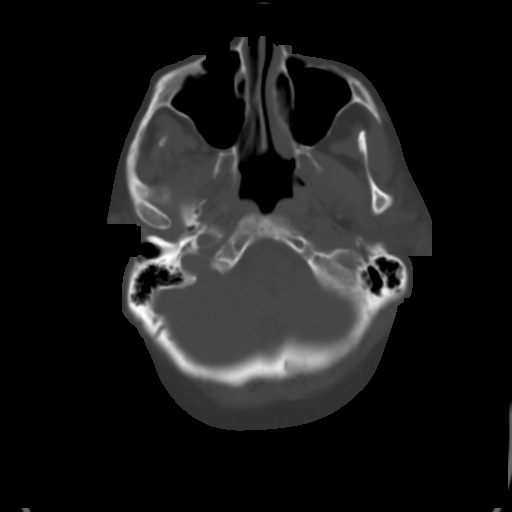
[im 7/30  brain]
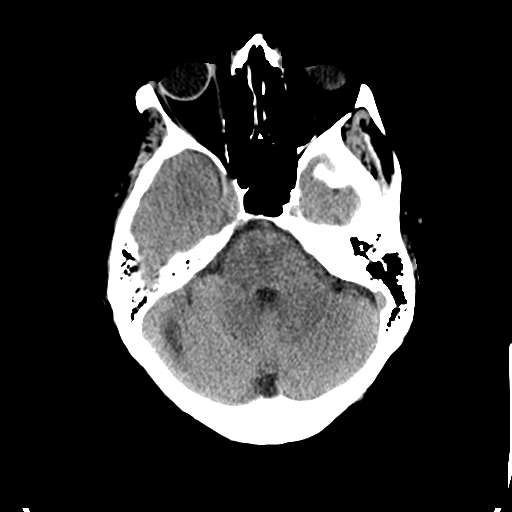
[im 9/30  brain]
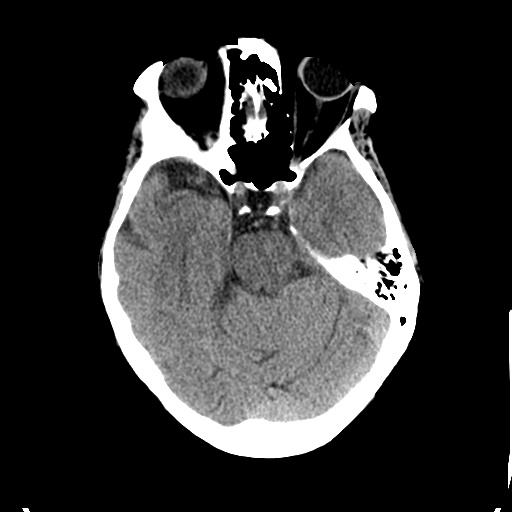
[im 13/30  brain]
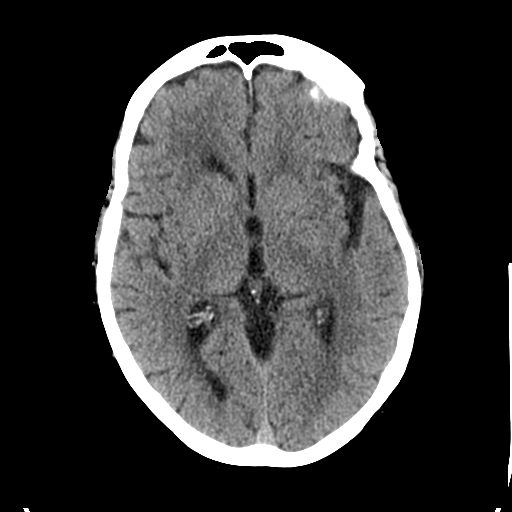
[im 15/30  brain]
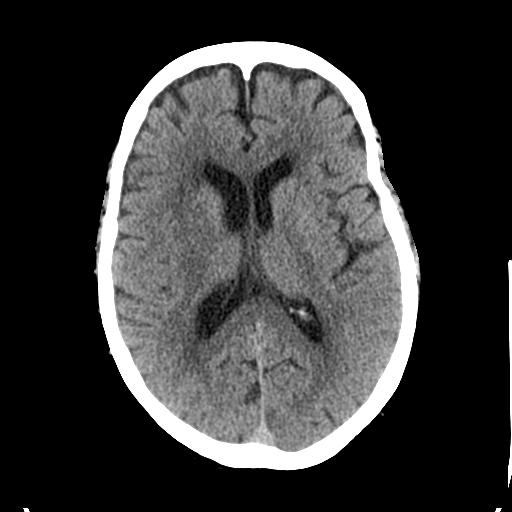
[im 15/30  bone]
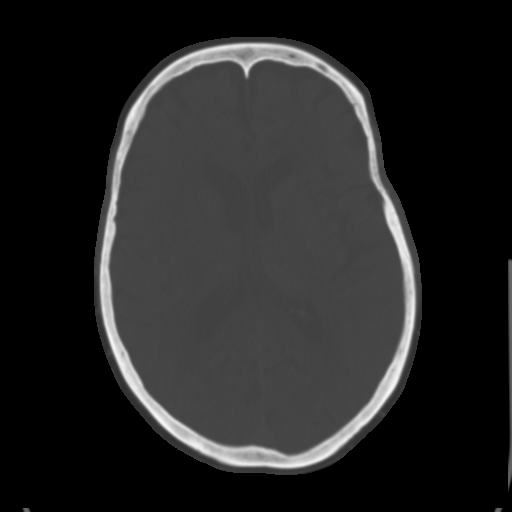
[im 17/30  brain]
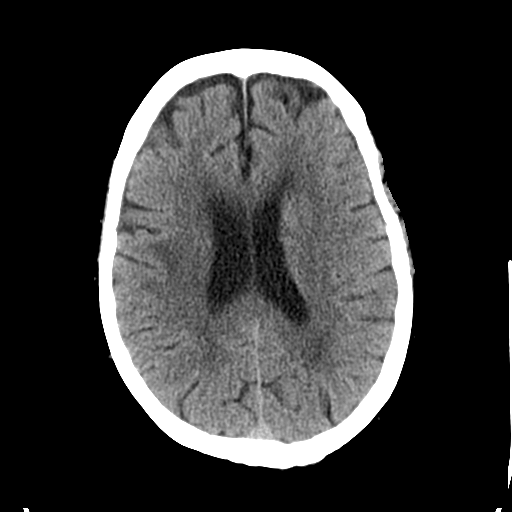
[im 21/30  brain]
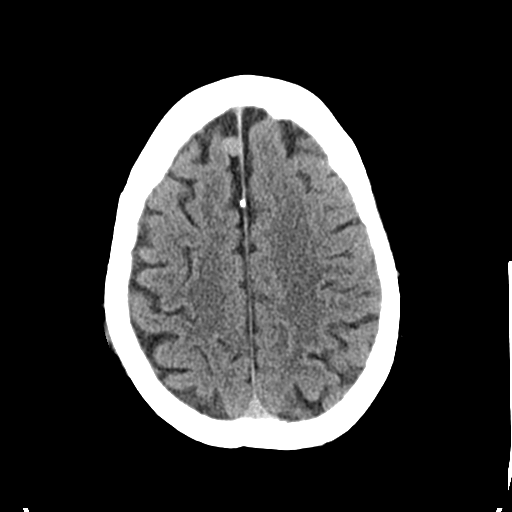
[im 23/30  brain]
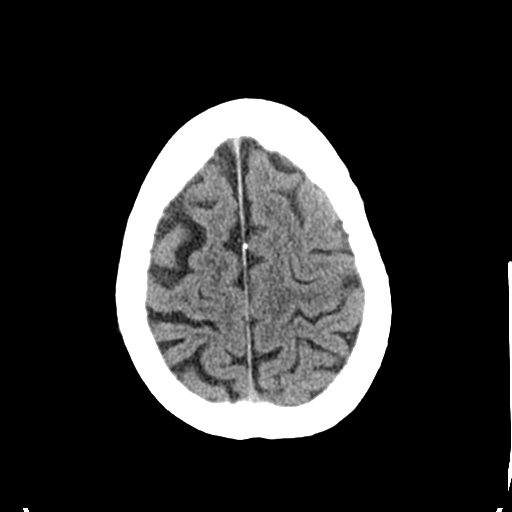
[im 27/30  brain]
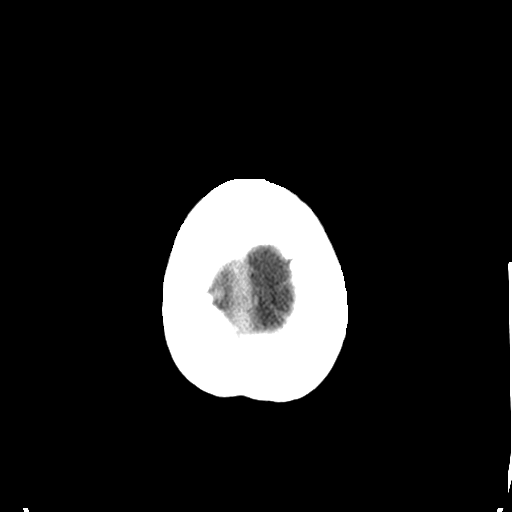
[im 27/30  bone]
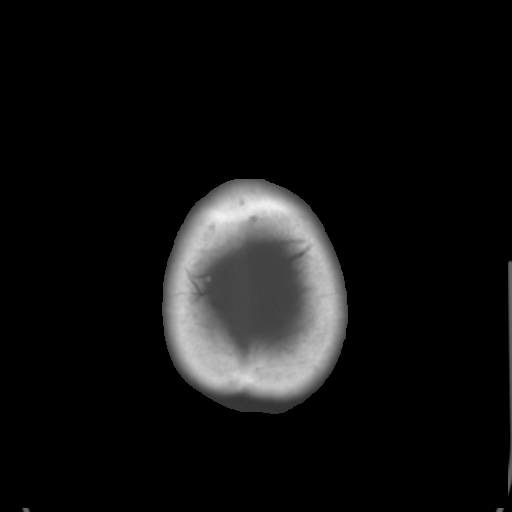

[Series 4: coronal · coronal · 0.29mm/px · 3 of 68 slices shown]
[im 23/68  brain]
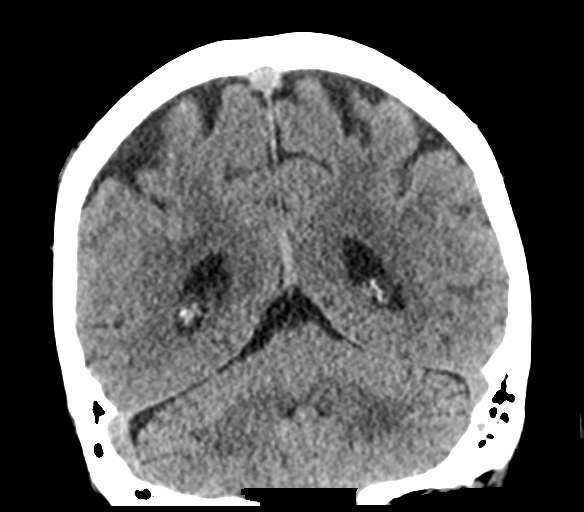
[im 30/68  brain]
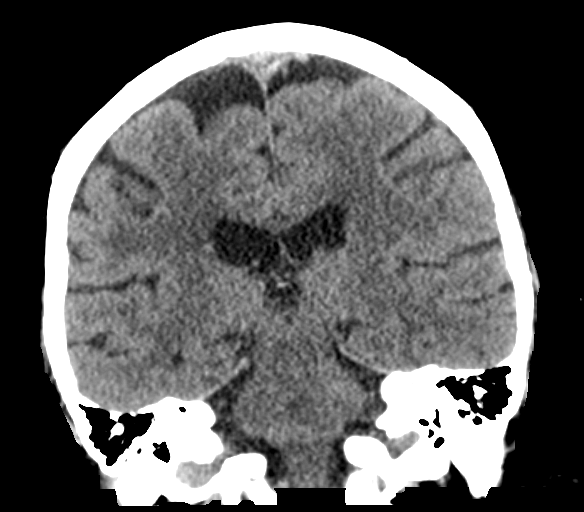
[im 38/68  brain]
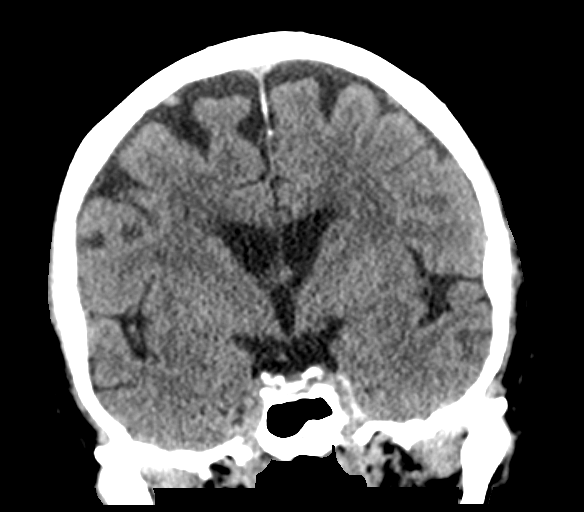

[Series 5: sagittal · sagittal · 0.35mm/px · 3 of 59 slices shown]
[im 20/59  brain]
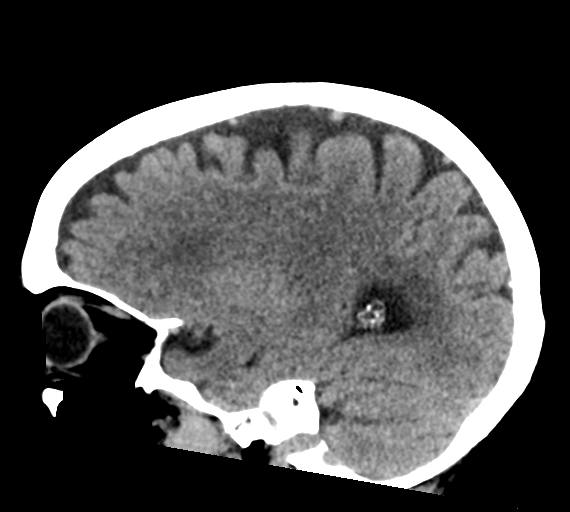
[im 30/59  brain]
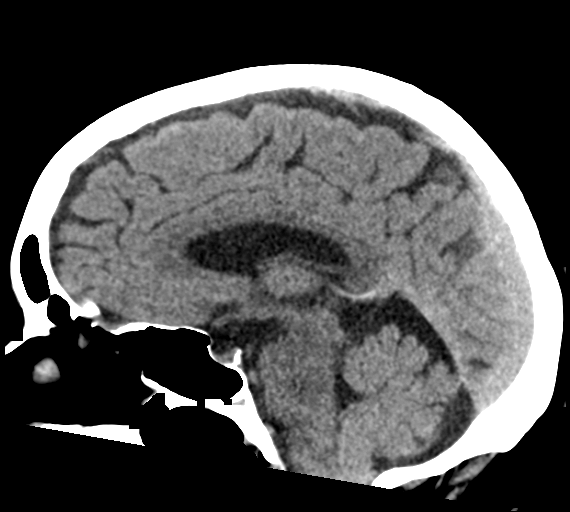
[im 39/59  brain]
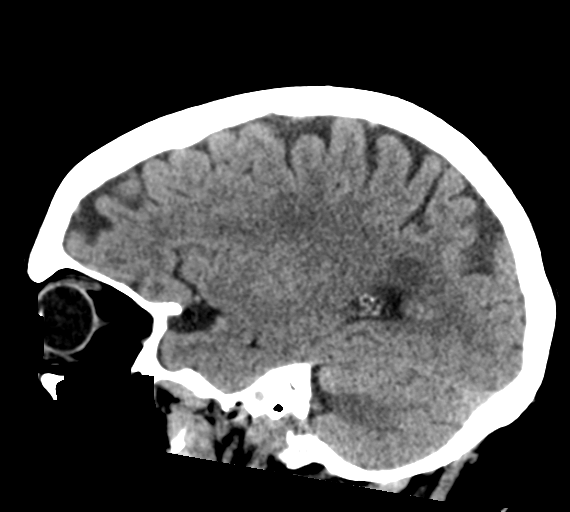

[15 of 47 positions shown; findings below may reference images not displayed]

FINDINGS: Brain: Scattered minimal periventricular hypodensities compatible
microvascular ischemic disease. Gray-white differentiation is
otherwise well maintained without CT evidence of acute large
territory infarct. No intraparenchymal or extra-axial hemorrhage.
Unchanged approximately 1.0 x 0.7 x 1.1 cm hyper attenuating
extra-axial mass about right side of the anterior aspect the midline
falx (image 21, series 2; sagittal image 27 series 5,) favored to
represent a meningioma, without associated mass effect and similar
in appearance compared to the [DATE] examination. No
intraparenchymal mass. Normal size and configuration the ventricles
and basilar cisterns. No midline shift.

Vascular: No hyperdense vessel or unexpected calcification.

Skull: No displaced calvarial fracture.

Sinuses/Orbits: Suspected tiny (approximately 0.5 cm) osteoid
osteoma within the left anterior ethmoidal air cells (image 9,
series 3). The remaining paranasal sinuses and mastoid air cells are
normally aerated. No air-fluid levels. Post bilateral cataract
surgery.

Other: Regional soft tissues appear normal.
IMPRESSION: 1. Mild atrophy without acute intracranial process.
2. Incidentally noted approximately 1.1 cm suspected meningioma
arising from the right side of the anterior aspect of the midline
falx similar to the [DATE] examination and again without associated
mass effect.

## 2018-02-05 DIAGNOSIS — R1312 Dysphagia, oropharyngeal phase: Secondary | ICD-10-CM | POA: Diagnosis not present

## 2018-03-25 DIAGNOSIS — Z23 Encounter for immunization: Secondary | ICD-10-CM | POA: Diagnosis not present

## 2018-03-25 DIAGNOSIS — R2242 Localized swelling, mass and lump, left lower limb: Secondary | ICD-10-CM | POA: Diagnosis not present

## 2018-04-07 DIAGNOSIS — R05 Cough: Secondary | ICD-10-CM | POA: Diagnosis not present

## 2018-04-07 DIAGNOSIS — R1312 Dysphagia, oropharyngeal phase: Secondary | ICD-10-CM | POA: Diagnosis not present

## 2018-04-08 DIAGNOSIS — R05 Cough: Secondary | ICD-10-CM | POA: Diagnosis not present

## 2018-04-17 DIAGNOSIS — G20 Parkinson's disease: Secondary | ICD-10-CM | POA: Diagnosis not present

## 2018-04-17 DIAGNOSIS — R69 Illness, unspecified: Secondary | ICD-10-CM | POA: Diagnosis not present

## 2018-04-17 DIAGNOSIS — E782 Mixed hyperlipidemia: Secondary | ICD-10-CM | POA: Diagnosis not present

## 2018-04-17 DIAGNOSIS — E039 Hypothyroidism, unspecified: Secondary | ICD-10-CM | POA: Diagnosis not present

## 2018-04-17 DIAGNOSIS — I10 Essential (primary) hypertension: Secondary | ICD-10-CM | POA: Diagnosis not present

## 2018-05-05 DIAGNOSIS — R05 Cough: Secondary | ICD-10-CM | POA: Diagnosis not present

## 2018-05-05 DIAGNOSIS — R1312 Dysphagia, oropharyngeal phase: Secondary | ICD-10-CM | POA: Diagnosis not present

## 2018-05-07 DIAGNOSIS — M25572 Pain in left ankle and joints of left foot: Secondary | ICD-10-CM | POA: Diagnosis not present

## 2018-05-13 DIAGNOSIS — I10 Essential (primary) hypertension: Secondary | ICD-10-CM | POA: Diagnosis not present

## 2018-05-13 DIAGNOSIS — E039 Hypothyroidism, unspecified: Secondary | ICD-10-CM | POA: Diagnosis not present

## 2018-05-13 DIAGNOSIS — G20 Parkinson's disease: Secondary | ICD-10-CM | POA: Diagnosis not present

## 2018-05-13 DIAGNOSIS — E782 Mixed hyperlipidemia: Secondary | ICD-10-CM | POA: Diagnosis not present

## 2018-05-13 DIAGNOSIS — R69 Illness, unspecified: Secondary | ICD-10-CM | POA: Diagnosis not present

## 2018-06-03 DIAGNOSIS — R69 Illness, unspecified: Secondary | ICD-10-CM | POA: Diagnosis not present

## 2018-06-03 DIAGNOSIS — Z0000 Encounter for general adult medical examination without abnormal findings: Secondary | ICD-10-CM | POA: Diagnosis not present

## 2018-06-03 DIAGNOSIS — Z1389 Encounter for screening for other disorder: Secondary | ICD-10-CM | POA: Diagnosis not present

## 2018-06-04 DIAGNOSIS — D329 Benign neoplasm of meninges, unspecified: Secondary | ICD-10-CM | POA: Diagnosis not present

## 2018-06-04 DIAGNOSIS — D320 Benign neoplasm of cerebral meninges: Secondary | ICD-10-CM | POA: Diagnosis not present

## 2018-06-04 DIAGNOSIS — Z923 Personal history of irradiation: Secondary | ICD-10-CM | POA: Diagnosis not present

## 2018-06-06 DIAGNOSIS — G20 Parkinson's disease: Secondary | ICD-10-CM | POA: Diagnosis not present

## 2018-06-06 DIAGNOSIS — G4752 REM sleep behavior disorder: Secondary | ICD-10-CM | POA: Diagnosis not present

## 2018-06-06 DIAGNOSIS — Z79899 Other long term (current) drug therapy: Secondary | ICD-10-CM | POA: Diagnosis not present

## 2018-06-06 DIAGNOSIS — R2689 Other abnormalities of gait and mobility: Secondary | ICD-10-CM | POA: Diagnosis not present

## 2018-07-08 DIAGNOSIS — R2689 Other abnormalities of gait and mobility: Secondary | ICD-10-CM | POA: Diagnosis not present

## 2018-07-08 DIAGNOSIS — R41841 Cognitive communication deficit: Secondary | ICD-10-CM | POA: Diagnosis not present

## 2018-07-08 DIAGNOSIS — G4752 REM sleep behavior disorder: Secondary | ICD-10-CM | POA: Diagnosis not present

## 2018-07-08 DIAGNOSIS — G20 Parkinson's disease: Secondary | ICD-10-CM | POA: Diagnosis not present

## 2018-07-22 DIAGNOSIS — I10 Essential (primary) hypertension: Secondary | ICD-10-CM | POA: Diagnosis not present

## 2018-07-22 DIAGNOSIS — R69 Illness, unspecified: Secondary | ICD-10-CM | POA: Diagnosis not present

## 2018-07-22 DIAGNOSIS — G20 Parkinson's disease: Secondary | ICD-10-CM | POA: Diagnosis not present

## 2018-07-22 DIAGNOSIS — G231 Progressive supranuclear ophthalmoplegia [Steele-Richardson-Olszewski]: Secondary | ICD-10-CM | POA: Diagnosis not present

## 2018-07-22 DIAGNOSIS — R32 Unspecified urinary incontinence: Secondary | ICD-10-CM | POA: Diagnosis not present

## 2018-07-22 DIAGNOSIS — E039 Hypothyroidism, unspecified: Secondary | ICD-10-CM | POA: Diagnosis not present

## 2018-07-22 DIAGNOSIS — E782 Mixed hyperlipidemia: Secondary | ICD-10-CM | POA: Diagnosis not present

## 2018-08-13 DIAGNOSIS — E782 Mixed hyperlipidemia: Secondary | ICD-10-CM | POA: Diagnosis not present

## 2018-08-13 DIAGNOSIS — G20 Parkinson's disease: Secondary | ICD-10-CM | POA: Diagnosis not present

## 2018-08-13 DIAGNOSIS — I10 Essential (primary) hypertension: Secondary | ICD-10-CM | POA: Diagnosis not present

## 2018-08-13 DIAGNOSIS — R69 Illness, unspecified: Secondary | ICD-10-CM | POA: Diagnosis not present

## 2018-08-13 DIAGNOSIS — E039 Hypothyroidism, unspecified: Secondary | ICD-10-CM | POA: Diagnosis not present

## 2018-09-30 DIAGNOSIS — R69 Illness, unspecified: Secondary | ICD-10-CM | POA: Diagnosis not present

## 2018-09-30 DIAGNOSIS — G20 Parkinson's disease: Secondary | ICD-10-CM | POA: Diagnosis not present

## 2018-09-30 DIAGNOSIS — E039 Hypothyroidism, unspecified: Secondary | ICD-10-CM | POA: Diagnosis not present

## 2018-09-30 DIAGNOSIS — I10 Essential (primary) hypertension: Secondary | ICD-10-CM | POA: Diagnosis not present

## 2018-09-30 DIAGNOSIS — E782 Mixed hyperlipidemia: Secondary | ICD-10-CM | POA: Diagnosis not present

## 2018-11-19 DIAGNOSIS — G20 Parkinson's disease: Secondary | ICD-10-CM | POA: Diagnosis not present

## 2018-11-19 DIAGNOSIS — E782 Mixed hyperlipidemia: Secondary | ICD-10-CM | POA: Diagnosis not present

## 2018-11-19 DIAGNOSIS — I10 Essential (primary) hypertension: Secondary | ICD-10-CM | POA: Diagnosis not present

## 2018-11-19 DIAGNOSIS — R69 Illness, unspecified: Secondary | ICD-10-CM | POA: Diagnosis not present

## 2018-11-19 DIAGNOSIS — E039 Hypothyroidism, unspecified: Secondary | ICD-10-CM | POA: Diagnosis not present

## 2018-11-25 DIAGNOSIS — R69 Illness, unspecified: Secondary | ICD-10-CM | POA: Diagnosis not present

## 2018-12-03 DIAGNOSIS — R69 Illness, unspecified: Secondary | ICD-10-CM | POA: Diagnosis not present

## 2018-12-15 DIAGNOSIS — R69 Illness, unspecified: Secondary | ICD-10-CM | POA: Diagnosis not present

## 2018-12-18 DIAGNOSIS — R69 Illness, unspecified: Secondary | ICD-10-CM | POA: Diagnosis not present

## 2018-12-18 DIAGNOSIS — E782 Mixed hyperlipidemia: Secondary | ICD-10-CM | POA: Diagnosis not present

## 2018-12-18 DIAGNOSIS — E039 Hypothyroidism, unspecified: Secondary | ICD-10-CM | POA: Diagnosis not present

## 2018-12-18 DIAGNOSIS — G20 Parkinson's disease: Secondary | ICD-10-CM | POA: Diagnosis not present

## 2018-12-18 DIAGNOSIS — I10 Essential (primary) hypertension: Secondary | ICD-10-CM | POA: Diagnosis not present

## 2019-01-01 DIAGNOSIS — R69 Illness, unspecified: Secondary | ICD-10-CM | POA: Diagnosis not present

## 2019-01-19 DIAGNOSIS — R1312 Dysphagia, oropharyngeal phase: Secondary | ICD-10-CM | POA: Diagnosis not present

## 2019-01-19 DIAGNOSIS — G20 Parkinson's disease: Secondary | ICD-10-CM | POA: Diagnosis not present

## 2019-01-20 DIAGNOSIS — E039 Hypothyroidism, unspecified: Secondary | ICD-10-CM | POA: Diagnosis not present

## 2019-01-20 DIAGNOSIS — E782 Mixed hyperlipidemia: Secondary | ICD-10-CM | POA: Diagnosis not present

## 2019-01-20 DIAGNOSIS — G20 Parkinson's disease: Secondary | ICD-10-CM | POA: Diagnosis not present

## 2019-01-20 DIAGNOSIS — R69 Illness, unspecified: Secondary | ICD-10-CM | POA: Diagnosis not present

## 2019-01-20 DIAGNOSIS — I10 Essential (primary) hypertension: Secondary | ICD-10-CM | POA: Diagnosis not present

## 2019-01-29 DIAGNOSIS — R1312 Dysphagia, oropharyngeal phase: Secondary | ICD-10-CM | POA: Diagnosis not present

## 2019-02-06 DIAGNOSIS — I10 Essential (primary) hypertension: Secondary | ICD-10-CM | POA: Diagnosis not present

## 2019-02-06 DIAGNOSIS — E039 Hypothyroidism, unspecified: Secondary | ICD-10-CM | POA: Diagnosis not present

## 2019-02-06 DIAGNOSIS — G20 Parkinson's disease: Secondary | ICD-10-CM | POA: Diagnosis not present

## 2019-02-06 DIAGNOSIS — R69 Illness, unspecified: Secondary | ICD-10-CM | POA: Diagnosis not present

## 2019-02-06 DIAGNOSIS — E782 Mixed hyperlipidemia: Secondary | ICD-10-CM | POA: Diagnosis not present

## 2019-02-10 DIAGNOSIS — I10 Essential (primary) hypertension: Secondary | ICD-10-CM | POA: Diagnosis not present

## 2019-02-10 DIAGNOSIS — R69 Illness, unspecified: Secondary | ICD-10-CM | POA: Diagnosis not present

## 2019-02-10 DIAGNOSIS — E039 Hypothyroidism, unspecified: Secondary | ICD-10-CM | POA: Diagnosis not present

## 2019-02-10 DIAGNOSIS — G20 Parkinson's disease: Secondary | ICD-10-CM | POA: Diagnosis not present

## 2019-02-10 DIAGNOSIS — E782 Mixed hyperlipidemia: Secondary | ICD-10-CM | POA: Diagnosis not present

## 2019-03-17 DIAGNOSIS — R69 Illness, unspecified: Secondary | ICD-10-CM | POA: Diagnosis not present

## 2019-03-31 ENCOUNTER — Ambulatory Visit: Payer: Medicare HMO | Admitting: Neurology

## 2019-04-02 NOTE — Progress Notes (Deleted)
Elizabeth Bauer was seen today in the movement disorders clinic for neurologic consultation at the request of Elizabeth Bauer, Elizabeth Base, MD.  The consultation is for the evaluation of Parkinson's disease.  I have notes from Elizabeth Bauer back to 2013 and I have reviewed an extensive number of records.  This took approximately 45 minutes of non-face-to-face time.  Onset of her symptoms was in 2011.  She was started on levodopa in January, 2012, initially with excellent benefit.  UPDRS off compared to on scores in 2014 showed marked response to levodopa.  However, she had early freezing, which was not levodopa responsive.  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  She had significant nausea with immediate release carbidopa/levodopa, including with domperidone.  She was changed to CR in 2018 to see if that would be beneficial.  As a side note, the patient did have a meningioma identified in the right medial frontal lobe.  She saw neurosurgery, Elizabeth Bauer, for this in April, 2018, as it had grown to 12 mm x 8 mm.  She was seen for consultation for gamma knife.  She had this completed in May, 2018.  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    Specific Symptoms:  Tremor: {yes no:314532} Family hx of similar:  {yes no:314532} Voice: *** Sleep: ***  Vivid Dreams:  {yes no:314532}  Acting out dreams:  {yes no:314532} is on clonazepam for this. Wet Pillows: {yes no:314532} Postural symptoms:  {yes no:314532}  Falls?  {yes no:314532} Bradykinesia symptoms:  {parkinson brady:18041} Loss of smell:  {yes no:314532} Loss of taste:  {yes no:314532} Urinary Incontinence:  {yes no:314532} Difficulty Swallowing:  {yes no:314532} Handwriting, micrographia: {yes no:314532} Trouble with ADL's:  {yes no:314532}  Trouble buttoning clothing: {yes no:314532} Depression:  {yes no:314532} Memory changes:  {yes no:314532} patient had neurocognitive testing on July 08, 2018.  Patient did meet the criteria for dementia, but type of dementia was not otherwise specified. Hallucinations:  {yes no:314532}  visual distortions: {yes no:314532} N/V:  {yes no:314532} Lightheaded:  {yes no:314532}  Syncope: {yes no:314532} Diplopia:  {yes no:314532} Dyskinesia:  {yes no:314532} Prior exposure to reglan/antipsychotics: {yes no:314532}  Neuroimaging of the brain has *** previously been performed.  It *** available for my review today.  She has been followed at [facility] for meningioma  PREVIOUS MEDICATIONS: {Parkinson's RX:18200}  ALLERGIES:  No Known Allergies  CURRENT MEDICATIONS:  Current Outpatient Medications  Medication Instructions  . acetaminophen (TYLENOL) 650 mg, Oral, Every 4 hours PRN  . aspirin EC 81 mg, Oral, Daily  . BIOTIN PO 1 tablet, Oral, Daily  . Carbidopa-Levodopa ER (SINEMET CR) 25-100 MG tablet controlled release 1 tablet, Oral, 3 times daily  . cetirizine (ZYRTEC) 10 mg, Oral, Daily PRN  . cholecalciferol (VITAMIN D) 1,000 Units, Oral, Daily  . clonazePAM (KLONOPIN) 0.5 mg, Oral, Daily at bedtime  . CRANBERRY PO 1 tablet, Oral, Daily  . fluticasone (FLONASE) 50 MCG/ACT nasal spray 1-2 sprays, Each Nare, Daily PRN  . ibuprofen (ADVIL) 600 mg, Oral, Every 6 hours PRN  . Ibuprofen-Diphenhydramine Cit (IBUPROFEN PM PO) 1 tablet,  Oral, Daily at bedtime  . levothyroxine (SYNTHROID) 88 mcg, Oral, Daily before breakfast  . oxyCODONE (OXY IR/ROXICODONE) 5 mg, Oral, Every 4 hours PRN  . Probiotic CAPS 1 capsule, Oral, Daily, From "[employer]"  .  solifenacin (VESICARE) 5 mg, Oral, 2 times daily  . TURMERIC PO 1 capsule, Oral, Daily  . Vitamin B-12 5,000 mcg, Oral, Daily    PAST MEDICAL HISTORY:   Past Medical History:  Diagnosis Date  . Cataract   . Parkinson disease ([place])   . Thyroid disease     PAST SURGICAL HISTORY:   Past Surgical History:  Procedure Laterality Date  . ABDOMINAL HYSTERECTOMY    . EYE SURGERY    . TONSILECTOMY, ADENOIDECTOMY, BILATERAL MYRINGOTOMY AND TUBES      SOCIAL HISTORY:   Social History   Socioeconomic History  . Marital status: Married    Spouse name: Not on file  . Number of children: Not on file  . Years of education: Not on file  . Highest education level: Not on file  Occupational History  . Not on file  Social Needs  . Financial resource strain: Not on file  . Food insecurity    Worry: Not on file    Inability: Not on file  . Transportation needs    Medical: Not on file    Non-medical: Not on file  Tobacco Use  . Smoking status: Former Research scientist (life sciences)  . Smokeless tobacco: Never Used  Substance and Sexual Activity  . Alcohol use: Yes  . Drug use: No  . Sexual activity: Not on file  Lifestyle  . Physical activity    Days per week: Not on file    Minutes per session: Not on file  . Stress: Not on file  Relationships  . Social Herbalist on phone: Not on file    Gets together: Not on file    Attends religious service: Not on file    Active member of club or organization: Not on file    Attends meetings of clubs or organizations: Not on file    Relationship status: Not on file  . Intimate partner violence    Fear of current or ex partner: Not on file    Emotionally abused: Not on file    Physically abused: Not on file    Forced sexual activity: Not on file  Other Topics Concern  . Not on file  Social History Narrative  . Not on file    FAMILY HISTORY:   Family Status  Relation Name Status  . Mother  Deceased  . Father  Deceased  . Daughter  Alive  .  MGM  Deceased  . MGF  Deceased  . PGM  Deceased  . PGF  Deceased    ROS:  ROS  PHYSICAL EXAMINATION:    VITALS:  There were no vitals filed for this visit.  GEN:  The patient appears stated age and is in NAD. HEENT:  Normocephalic, atraumatic.  The mucous membranes are moist. The superficial temporal arteries are without ropiness or tenderness. CV:  RRR Lungs:  CTAB Neck/HEME:  There are no carotid bruits bilaterally.  Neurological examination:  Orientation: The patient is alert and oriented x3. Fund of knowledge is appropriate.  Recent and remote memory are intact.  Attention and concentration are normal.    Able to name objects and repeat phrases. Cranial nerves: There is good facial symmetry. Pupils are equal round and reactive to light bilaterally. Fundoscopic exam reveals  clear margins bilaterally. Extraocular muscles are intact. The visual fields are full to confrontational testing. The speech is fluent and clear. Soft palate rises symmetrically and there is no tongue deviation. Hearing is intact to conversational tone. Sensation: Sensation is intact to light and pinprick throughout (facial, trunk, extremities). Vibration is intact at the bilateral big toe. There is no extinction with double simultaneous stimulation. There is no sensory dermatomal level identified. Motor: Strength is 5/5 in the bilateral upper and lower extremities.   Shoulder shrug is equal and symmetric.  There is no pronator drift. Deep tendon reflexes: Deep tendon reflexes are 2/4 at the bilateral biceps, triceps, brachioradialis, patella and achilles. Plantar responses are downgoing bilaterally.  Movement examination: Tone: There is ***tone in the bilateral upper extremities.  The tone in the lower extremities is ***.  Abnormal movements: *** Coordination:  There is *** decremation with RAM's, *** Gait and Station: The patient has *** difficulty arising out of a deep-seated chair without the use of the hands.  The patient's stride length is ***.  The patient has a *** pull test.      ASSESSMENT/PLAN:  ***1.  Parkinsonism  -Patient symptoms began in 2011.  Diagnosis has been somewhat unclear, as she had early freezing.  She has seen Elizabeth Bauer for many years, and he entertained the diagnosis of PSP initially, but that did not seem to progress as had expected.  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  It appears that part of the issue is that she has not been able to tolerate levodopa over the years to get up to a good dose.  2.  Parafalcine meningioma  -Patient follows with Elizabeth Bauer.  She had gamma knife surgery in May, 2018.  Her last MRI of the brain was on June 04, 2018.  There is no substantial change in her meningioma size, measuring 1.1 x 1.0 cm.  3.  Dementia  -Patient had neurocognitive testing at [facility] in January, 2020.  This identified neurocognitive change, including dementia, although type was not identified.  Safety discussed.  ***This did not include the 40 min of record review which was detailed above, which was non face to face time.   Cc:  Josetta Huddle, MD

## 2019-04-06 ENCOUNTER — Ambulatory Visit: Payer: Medicare HMO | Admitting: Neurology

## 2019-04-30 NOTE — Progress Notes (Signed)
Elizabeth Bauer was seen today in the movement disorders clinic for neurologic consultation at the request of Josetta Huddle, MD.  The consultation is for the evaluation of Parkinson's disease.  I have notes from Dr. Tonye Royalty back to 2013 and I have reviewed an extensive number of records.  This took approximately 45 minutes of non-face-to-face time.  Husband is present and supplements the history.  Onset of her symptoms was in 2011.  She was started on levodopa in January, 2012, initially with excellent benefit.  UPDRS off compared to on scores in 2014 showed marked response to levodopa.  However, she had early freezing, which was not levodopa responsive.  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  She had significant nausea with immediate release carbidopa/levodopa, including with domperidone.  She was changed to CR in 2018 to see if that would be beneficial.  As a side note, the patient did have a meningioma identified in the right medial frontal lobe.  She saw neurosurgery, Dr. Arlan Organ, for this in April, 2018, as it had grown to 12 mm x 8 mm.  She was seen for consultation for gamma knife.  She had this completed in May, 2018.  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 Family hx of similar:  No. Voice: weaker Sleep: sleeps well with klonopin  Vivid Dreams:  Yes.    Acting out dreams:  No. per pt but records indicate that is on clonazepam for this. Wet Pillows: No. Postural symptoms:  Yes.    Falls?  Yes.  , pushes transport chair to walk but uses electric scooter in house  Bradykinesia symptoms: shuffling gait, drooling while awake, difficulty getting out of a chair and difficulty regaining balance Loss of smell:  No. Loss of taste:  No. Urinary Incontinence:  Yes.   for years wears depends Difficulty Swallowing:  Yes.   with pills Handwriting, micrographia: Yes.   Trouble with ADL's:  Yes.    Trouble buttoning clothing: Yes.   Depression:  Yes.   Memory changes:  Yes.   patient had neurocognitive testing on July 08, 2018.  Patient did meet the criteria for dementia, but type of dementia was not otherwise specified. Hallucinations:  No.  visual distortions: No. N/V:  Yes.  , related to medication Lightheaded:  No.  Syncope: No. Diplopia:  No. Dyskinesia:  No. Prior exposure to reglan/antipsychotics: No.  PREVIOUS MEDICATIONS: Sinemet and Sinemet CR  ALLERGIES:  No Known Allergies  CURRENT MEDICATIONS:  Current Outpatient Medications  Medication Instructions  . acetaminophen (TYLENOL) 650 mg, Oral, Every 4 hours PRN  . aspirin EC 81 mg, Oral, Daily  . BIOTIN PO 1 tablet, Oral, Daily  . Carbidopa-Levodopa ER (SINEMET CR) 25-100 MG tablet controlled release 1 tablet, Oral, 3 times daily  . cetirizine (ZYRTEC) 10 mg, Oral, Daily PRN  . cholecalciferol (VITAMIN D) 1,000 Units, Oral, Daily  . clonazePAM (KLONOPIN) 0.5 mg, Oral, Daily at bedtime  . CRANBERRY PO 1 tablet, Oral, Daily  . fluticasone (FLONASE) 50 MCG/ACT nasal spray 1-2 sprays, Each Nare, Daily PRN  . ibuprofen (ADVIL) 600 mg,  Oral, Every 6 hours PRN  . Ibuprofen-Diphenhydramine Cit (IBUPROFEN PM PO) 1 tablet, Oral, Daily at bedtime  . levothyroxine (SYNTHROID) 88 mcg, Oral, Daily before breakfast  . Mirabegron (MYRBETRIQ PO) Oral  . Probiotic CAPS 1 capsule, Oral, Daily, From "[employer]"  . TURMERIC PO 1 capsule, Oral, Daily  . Vitamin B-12 5,000 mcg, Oral, Daily    PAST MEDICAL HISTORY:   Past Medical History:  Diagnosis Date  . Cataract   . Parkinson disease ([place])   .  Thyroid disease     PAST SURGICAL HISTORY:   Past Surgical History:  Procedure Laterality Date  . ABDOMINAL HYSTERECTOMY    . CATARACT EXTRACTION, BILATERAL    . TONSILECTOMY, ADENOIDECTOMY, BILATERAL MYRINGOTOMY AND TUBES      SOCIAL HISTORY:   Social History   Socioeconomic History  . Marital status: Married    Spouse name: Not on file  . Number of children: Not on file  . Years of education: Not on file  . Highest education level: Not on file  Occupational History  . Not on file  Social Needs  . Financial resource strain: Not on file  . Food insecurity    Worry: Not on file    Inability: Not on file  . Transportation needs    Medical: Not on file    Non-medical: Not on file  Tobacco Use  . Smoking status: Former Research scientist (life sciences)  . Smokeless tobacco: Never Used  Substance and Sexual Activity  . Alcohol use: Yes    Comment: occasional wine  . Drug use: No  . Sexual activity: Not on file  Lifestyle  . Physical activity    Days per week: Not on file    Minutes per session: Not on file  . Stress: Not on file  Relationships  . Social Herbalist on phone: Not on file    Gets together: Not on file    Attends religious service: Not on file    Active member of club or organization: Not on file    Attends meetings of clubs or organizations: Not on file    Relationship status: Not on file  . Intimate partner violence    Fear of current or ex partner: Not on file    Emotionally abused: Not on file    Physically abused: Not on file    Forced sexual activity: Not on file  Other Topics Concern  . Not on file  Social History Narrative  . Not on file    FAMILY HISTORY:   Family Status  Relation Name Status  . Mother  Deceased  . Father  Deceased  . Daughter  Alive  . MGM  Deceased  . MGF  Deceased  . PGM  Deceased  . PGF  Deceased    ROS:  Review of Systems  Constitutional: Positive for malaise/fatigue.  HENT: Negative.   Eyes: Negative.   Respiratory:  Negative.   Cardiovascular: Negative.   Gastrointestinal: Positive for constipation.  Genitourinary:       Incontinence   Skin: Negative.   Endo/Heme/Allergies: Negative.     PHYSICAL EXAMINATION:    VITALS:   Vitals:   05/04/19 0943  BP: (!) 157/85  Pulse: 76  Resp: 18  Temp: 98.6 F (37 C)  SpO2: 96%    GEN:  The patient appears stated age and is in NAD. HEENT:  Normocephalic, atraumatic.  The mucous membranes are moist. The superficial temporal arteries are without ropiness or tenderness. CV:  RRR Lungs:  CTAB Neck/HEME:  There are no carotid bruits bilaterally.  Neurological examination:  Orientation: The patient is alert and oriented x3.  She, however, relies on her husband heavily for the remainder of the history. Cranial nerves: There is good facial symmetry. Extraocular muscles are intact. The visual fields are full to confrontational testing. The speech is fluent and clear. Soft palate rises symmetrically and there is no tongue deviation. Hearing is intact to conversational tone. Sensation: Sensation is intact to light and pinprick throughout (facial, trunk, extremities). Vibration is intact at the bilateral big toe. There is no extinction with double simultaneous stimulation. There is no sensory dermatomal level identified. Motor: Strength is 5/5 in the bilateral upper and lower extremities.   Shoulder shrug is equal and symmetric.  There is no pronator drift.  Movement examination: Tone: There is normal tone in the UE/LE Abnormal movements: none Coordination:  There is no decremation with RAM's, with any form of RAMS, including alternating supination and pronation of the forearm, hand opening and closing, finger taps, heel taps and toe taps.  There is apraxia with heel and toe taps bilaterally Gait and Station: The patients husband pulls her out of the chair.  She uses the transport as a walker (walks behind it).  She has start hesitation.  Once she is out in the  hallway, she does good until she needs to turn and then freezes in the turn.  She is short stepped.    ASSESSMENT/PLAN:  1.  Parkinsonism  -Patient symptoms began in 2011.  Diagnosis has been somewhat unclear, as she had early freezing.  She has seen Dr. Tonye Royalty for many years, and he entertained the diagnosis of PSP initially, but that did not seem to progress as had expected.  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  It appears that part of the issue is that she has not been able to tolerate levodopa over the years to get up to a good dose.  Based on medical records, I would guess that PD is the best diagnosis (as opposed to primary progressive freezing of gait) as notes indicate an initial robust response to carbidopa/levodopa, although her husband is really not sure about that.  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  I discussed this with the patient and her husband.  She is only on one single levodopa tablet, which certainly is not helpful.  Again, it is unclear if that is not helpful because of the low dose (has not been able to tolerate because of nausea) or if because of the fact that she has an alternative diagnosis like vascular parkinsonism.  Ultimately, we decided to give her samples of Rytary, 95 mg, 1 capsule 3 times per day.  This is a very low dose, but it is usually very well-tolerated.  We will need to increase the dose, but we will start here and see if she tolerates it.  -Continue exercise with ACT  -Met with my social worker today.  2.  Parafalcine meningioma  -Patient follows with Dr. Arlan Organ.  She had gamma knife surgery in May, 2018.  Her last MRI of the brain was on June 04, 2018.  There is no substantial change in her meningioma size, measuring 1.1 x 1.0 cm.  3.  Dementia  -Patient had [facility]cognitive testing at [facility] in January, 2020.  This identified neurocognitive change, including  dementia, although type was not identified.  Safety discussed.  Husband is providing 24 hour/day care.  4.  Sialorrhea  -This is commonly associated with PD.  We talked about treatments.  The patient is not a candidate for oral anticholinergic therapy because of increased risk of confusion and falls.  We discussed Botox (type A and B) and 1% atropine drops.  We discusssed that candy like lemon drops can help by stimulating mm of the oropharynx to induce swallowing.  Husband really does not think it is bad enough to do alternative treatments right now.  5.  Nausea  -if still has nausea with rytary, could consider GI eval which she has never had.  Discussed with pt/husband today.  6.  Follow up is anticipated in the next 4-6 months, sooner should new neurologic issues arise.  Much greater than 50% of this visit was spent in counseling and coordinating care.  Total face to face time:  60 min.  This did not include the 45 min of record review which was detailed above, which was non face to face time.   Cc:  Josetta Huddle, MD

## 2019-05-04 ENCOUNTER — Encounter: Payer: Self-pay | Admitting: Neurology

## 2019-05-04 ENCOUNTER — Ambulatory Visit: Payer: Medicare HMO | Admitting: Neurology

## 2019-05-04 ENCOUNTER — Other Ambulatory Visit: Payer: Self-pay

## 2019-05-04 VITALS — BP 157/85 | HR 76 | Temp 98.6°F | Resp 18

## 2019-05-04 DIAGNOSIS — F0150 Vascular dementia without behavioral disturbance: Secondary | ICD-10-CM | POA: Diagnosis not present

## 2019-05-04 DIAGNOSIS — R69 Illness, unspecified: Secondary | ICD-10-CM | POA: Diagnosis not present

## 2019-05-04 DIAGNOSIS — K117 Disturbances of salivary secretion: Secondary | ICD-10-CM | POA: Diagnosis not present

## 2019-05-04 DIAGNOSIS — G20 Parkinson's disease: Secondary | ICD-10-CM | POA: Diagnosis not present

## 2019-05-04 DIAGNOSIS — D329 Benign neoplasm of meninges, unspecified: Secondary | ICD-10-CM | POA: Diagnosis not present

## 2019-05-04 DIAGNOSIS — R110 Nausea: Secondary | ICD-10-CM | POA: Diagnosis not present

## 2019-05-04 MED ORDER — RYTARY 23.75-95 MG PO CPCR: 1.0000 | ORAL_CAPSULE | Freq: Three times a day (TID) | ORAL | 0 refills | Status: DC

## 2019-05-04 NOTE — Patient Instructions (Signed)
1.  Stop carbidopa/levodopa 25/100 CR 2.  Start rytary 95 mg, 1 tablet three times per day (at 8am/noon/4pm).  You can take this with applesauce, ice cream, carbohydrate but try to keep away from protein source by about 30 min to 1 hour 3.  Let me know if you would like to do botox for the drooling.  The physicians and staff at [facility] are committed to providing excellent care. You may receive a survey requesting feedback about your experience at our office. We strive to receive "very good" responses to the survey questions. If you feel that your experience would prevent you from giving the office a "very good " response, please contact our office to try to remedy the situation. We may be reached at [phone]. Thank you for taking the time out of your busy day to complete the survey.

## 2019-05-05 DIAGNOSIS — R633 Feeding difficulties: Secondary | ICD-10-CM | POA: Diagnosis not present

## 2019-05-05 DIAGNOSIS — R1310 Dysphagia, unspecified: Secondary | ICD-10-CM | POA: Diagnosis not present

## 2019-05-15 ENCOUNTER — Other Ambulatory Visit (HOSPITAL_COMMUNITY): Payer: Self-pay | Admitting: *Deleted

## 2019-05-15 DIAGNOSIS — R1310 Dysphagia, unspecified: Secondary | ICD-10-CM

## 2019-05-29 ENCOUNTER — Ambulatory Visit (HOSPITAL_COMMUNITY)
Admission: RE | Admit: 2019-05-29 | Discharge: 2019-05-29 | Disposition: A | Discharge status: Home / Self Care | Payer: Medicare HMO | Source: Ambulatory Visit | Attending: Internal Medicine | Admitting: Internal Medicine

## 2019-05-29 ENCOUNTER — Other Ambulatory Visit: Payer: Self-pay

## 2019-05-29 DIAGNOSIS — R1312 Dysphagia, oropharyngeal phase: Secondary | ICD-10-CM | POA: Insufficient documentation

## 2019-05-29 DIAGNOSIS — G20 Parkinson's disease: Secondary | ICD-10-CM | POA: Diagnosis not present

## 2019-05-29 DIAGNOSIS — R1310 Dysphagia, unspecified: Secondary | ICD-10-CM | POA: Diagnosis not present

## 2019-05-29 NOTE — Therapy (Signed)
Modified Barium Swallow Progress Note  Patient Details  Name: Elizabeth Bauer MRN: [id number] Date of Birth: 1941/04/23  Today's Date: 05/29/2019  Modified Barium Swallow completed.  Full report located under Chart Review in the Imaging Section.  Brief recommendations include the following:  Clinical Impression Pt presents with mild oropharyngeal dysphagia, characterized by piecemeal swallow with premature spillage across consistencies. Swallow reflex triggered at the vallecular sinus on nectar thick liquids and solid textures, and at the pyriform sinus on thin liquids and puree textures. There was no post-swallow pharyngeal residue after any consistency. Trace flash penetration of thin and nectar thick liquids was seen intermittently, even when pt was challenged to drink multiple large boluses consecutively. Barium tablet cleared the oropharynx with multiple boluses of water. Esophageal sweep was clear. Recommendations include: soft chopped foods for energy conservation, Thin liquids via cup or straw, Meds whole with liquid, given one at a time. Given Parkinson's, pt is at increased aspiration risk with fatigue. While pt is able to self feed, she may benefit from feeding assistance for energy conservation. Safe swallow precautions were written and reviewed with pt and her husband after this study.   Swallow Evaluation Recommendations  SLP Diet Recommendations: Dysphagia 3 (Mech soft) solids;Thin liquid   Liquid Administration via: Cup;Straw   Medication Administration: Whole meds with liquid   Supervision: Patient able to self feed;Staff to assist with self feeding;Full supervision/cueing for compensatory strategies   Compensations: Slow rate;Small sips/bites   Postural Changes: Seated upright at 90 degrees   Oral Care Recommendations: Oral care QID  Enriqueta Shutter, [place], [place] Pathologist Office: [phone] Pager: [phone]   Shonna Chock 05/29/2019,2:30  PM

## 2019-06-12 DIAGNOSIS — Z8249 Family history of ischemic heart disease and other diseases of the circulatory system: Secondary | ICD-10-CM | POA: Diagnosis not present

## 2019-06-12 DIAGNOSIS — N3281 Overactive bladder: Secondary | ICD-10-CM | POA: Diagnosis not present

## 2019-06-12 DIAGNOSIS — G20 Parkinson's disease: Secondary | ICD-10-CM | POA: Diagnosis not present

## 2019-06-12 DIAGNOSIS — R030 Elevated blood-pressure reading, without diagnosis of hypertension: Secondary | ICD-10-CM | POA: Diagnosis not present

## 2019-06-12 DIAGNOSIS — R69 Illness, unspecified: Secondary | ICD-10-CM | POA: Diagnosis not present

## 2019-06-12 DIAGNOSIS — Z809 Family history of malignant neoplasm, unspecified: Secondary | ICD-10-CM | POA: Diagnosis not present

## 2019-06-12 DIAGNOSIS — G4700 Insomnia, unspecified: Secondary | ICD-10-CM | POA: Diagnosis not present

## 2019-06-12 DIAGNOSIS — R32 Unspecified urinary incontinence: Secondary | ICD-10-CM | POA: Diagnosis not present

## 2019-07-30 DIAGNOSIS — Z1389 Encounter for screening for other disorder: Secondary | ICD-10-CM | POA: Diagnosis not present

## 2019-07-30 DIAGNOSIS — R269 Unspecified abnormalities of gait and mobility: Secondary | ICD-10-CM | POA: Diagnosis not present

## 2019-07-30 DIAGNOSIS — R8290 Unspecified abnormal findings in urine: Secondary | ICD-10-CM | POA: Diagnosis not present

## 2019-07-30 DIAGNOSIS — G20 Parkinson's disease: Secondary | ICD-10-CM | POA: Diagnosis not present

## 2019-07-30 DIAGNOSIS — E039 Hypothyroidism, unspecified: Secondary | ICD-10-CM | POA: Diagnosis not present

## 2019-07-30 DIAGNOSIS — R69 Illness, unspecified: Secondary | ICD-10-CM | POA: Diagnosis not present

## 2019-07-30 DIAGNOSIS — E782 Mixed hyperlipidemia: Secondary | ICD-10-CM | POA: Diagnosis not present

## 2019-07-30 DIAGNOSIS — Z0001 Encounter for general adult medical examination with abnormal findings: Secondary | ICD-10-CM | POA: Diagnosis not present

## 2019-07-30 DIAGNOSIS — I10 Essential (primary) hypertension: Secondary | ICD-10-CM | POA: Diagnosis not present

## 2019-08-16 ENCOUNTER — Ambulatory Visit: Payer: Medicare HMO | Attending: Internal Medicine

## 2019-08-16 DIAGNOSIS — Z23 Encounter for immunization: Secondary | ICD-10-CM | POA: Insufficient documentation

## 2019-08-16 NOTE — Progress Notes (Signed)
   Covid-19 Vaccination Clinic  Name:  Elizabeth Bauer    MRN: [id number] DOB: [birth year]  08/16/2019  Elizabeth Bauer was observed post Covid-19 immunization for 15 minutes without incidence. She was provided with Vaccine Information Sheet and instruction to access the V-Safe system.   Elizabeth Bauer was instructed to call 911 with any severe reactions post vaccine: Marland Kitchen Difficulty breathing  . Swelling of your face and throat  . A fast heartbeat  . A bad rash all over your body  . Dizziness and weakness    Immunizations Administered    Name Date Dose VIS Date Route   Pfizer COVID-19 Vaccine 08/16/2019 12:06 PM 0.3 mL 06/05/2019 Intramuscular   Manufacturer: [place]   Lot: [id number]   [place]: [id number]

## 2019-09-09 ENCOUNTER — Ambulatory Visit: Payer: Medicare HMO | Attending: Internal Medicine

## 2019-09-09 DIAGNOSIS — Z23 Encounter for immunization: Secondary | ICD-10-CM

## 2019-09-09 NOTE — Progress Notes (Signed)
   Covid-19 Vaccination Clinic  Name:  Elizabeth Bauer    MRN: [id number] DOB: Mar 19, 1941  09/09/2019  Ms. Chamber was observed post Covid-19 immunization for 15 minutes without incident. She was provided with Vaccine Information Sheet and instruction to access the V-Safe system.   [name] was instructed to call 911 with any severe reactions post vaccine: Marland Kitchen Difficulty breathing  . Swelling of face and throat  . A fast heartbeat  . A bad rash all over body  . Dizziness and weakness   Immunizations Administered    Name Date Dose VIS Date Route   Pfizer COVID-19 Vaccine 09/09/2019  8:48 AM 0.3 mL 06/05/2019 Intramuscular   Manufacturer: [place]   Lot: [id number]   [place]: [id number]

## 2019-09-29 DIAGNOSIS — E039 Hypothyroidism, unspecified: Secondary | ICD-10-CM | POA: Diagnosis not present

## 2019-09-29 DIAGNOSIS — E7800 Pure hypercholesterolemia, unspecified: Secondary | ICD-10-CM | POA: Diagnosis not present

## 2019-10-06 DIAGNOSIS — L989 Disorder of the skin and subcutaneous tissue, unspecified: Secondary | ICD-10-CM | POA: Diagnosis not present

## 2019-10-06 DIAGNOSIS — K117 Disturbances of salivary secretion: Secondary | ICD-10-CM | POA: Diagnosis not present

## 2019-11-06 NOTE — Progress Notes (Signed)
Assessment/Plan:   1.  Parkinsonism  -Patient with symptoms since 2011  -Patient fairly new to my practice.  Patient under the care of Dr. 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  The issue really was that she could not tolerate levodopa over the years, but really medical records suggested that she had a robust response to levodopa, but could not tolerate it.  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  She has been able to tolerate low-dose Rytary.  I have not been able to see her on it.  Gave her samples of Rytary, 145 mg, that she will take 1 tablet 3 times per day for the next 5 weeks and then she will increase to Rytary 195 mg 1 tablet 3 times per day.  We will call them and find out how she was doing and see if it helped her walking at all (currently her husband states that she is unable to walk at all)  2.  Parafalcine meningioma  -Follows with Dr. Arlan Organ  -Status post gamma knife surgery in May, 2018  -Last MRI in December, 2019  -Patient's meningioma size currently measures 1.1 x 1.0 cm  3.  Dementia  -Patient with neurocognitive testing at [facility] in January, 2020.  Husband provides 24 hour/day care.  4.  Sialorrhea  -Not interested in Botox right now.  5.  Dysphagia  -Modified barium swallow in December, 2020 with recommendation for mechanical soft diet with thin liquids.  6.  HTN  -f/u pcp  Subjective:   Elizabeth Bauer was seen today in follow up for parkinsonism.  My previous records were reviewed prior to todays visit as well as outside records available to me.  Patient had not been able to tolerate levodopa well over the years, so last visit we decided to start very low-dose Rytary, 95 mg, 1 capsule 3 times per day.  They took it but didn't call me when she ran out of the  samples. She tolerated it well though, but it didn't help much.   Pt denies falls.  Pt denies lightheadedness, near syncope.  No hallucinations.  Patient had a modified barium swallow on December 4, which resulted in mild oropharyngeal dysphagia.  There was trace flash penetration of thin and nectar thick liquids.  Mechanical soft diet with thin liquids was recommended.  Current prescribed movement disorder medications: Rytary, 95 mg, 1 capsule 3 times per day (she has run out of the medication so not on any)    ALLERGIES:  No Known Allergies  CURRENT MEDICATIONS:  Outpatient Encounter Medications as of 11/11/2019  Medication Sig  . acetaminophen (TYLENOL) 325 MG tablet Take 2 tablets (650 mg total) by mouth every 4 (four) hours as needed for mild pain.  Marland Kitchen aspirin EC 81 MG tablet Take 81 mg by mouth daily.   Marland Kitchen BIOTIN PO Take 1 tablet by mouth daily.   . cetirizine (ZYRTEC) 10 MG tablet Take 10 mg by mouth daily as needed for allergies or rhinitis.   . cholecalciferol (VITAMIN D) 1000 units tablet Take 1,000 Units by mouth daily.  Marland Kitchen CRANBERRY PO Take 1 tablet by mouth daily.  . Cyanocobalamin (VITAMIN B-12) 5000 MCG TBDP Take 5,000 mcg by mouth daily.  . fluticasone (FLONASE) 50 MCG/ACT nasal spray Place 1-2 sprays into both nostrils daily as needed for allergies or rhinitis.  Marland Kitchen  ibuprofen (ADVIL,MOTRIN) 600 MG tablet Take 1 tablet (600 mg total) by mouth every 6 (six) hours as needed for moderate pain.  . Ibuprofen-Diphenhydramine Cit (IBUPROFEN PM PO) Take 1 tablet by mouth at bedtime.  Marland Kitchen levothyroxine (SYNTHROID, LEVOTHROID) 88 MCG tablet Take 88 mcg by mouth daily before breakfast.   . Mirabegron (MYRBETRIQ PO) Take 1 tablet by mouth daily.   . Probiotic CAPS Take 1 capsule by mouth daily. From "[employer]"  . TURMERIC PO Take 1 capsule by mouth daily.   . Carbidopa-Levodopa ER (RYTARY) 23.75-95 MG CPCR Take 1 tablet by mouth 3 (three) times daily. (Patient not taking: Reported on 11/11/2019)   . [DISCONTINUED] clonazePAM (KLONOPIN) 0.5 MG tablet Take 0.5 mg by mouth at bedtime.   No facility-administered encounter medications on file as of 11/11/2019.    Objective:   PHYSICAL EXAMINATION:    VITALS:   Vitals:   11/11/19 0940  BP: (!) 191/73  Pulse: 81  SpO2: 95%  Weight: 151 lb (68.5 kg)  Height: 5\' 4"  (1.626 m)    GEN:  The patient appears stated age and is in NAD. HEENT:  Normocephalic, atraumatic.  The mucous membranes are moist. The superficial temporal arteries are without ropiness or tenderness. CV:  RRR Lungs:  CTAB Neck/HEME:  There are no carotid bruits bilaterally.  Neurological examination:  Orientation: The patient is alert and oriented x3. Cranial nerves: There is good facial symmetry with minimal facial hypomimia. The speech is fluent and clear. Soft palate rises symmetrically and there is no tongue deviation. Hearing is intact to conversational tone. Sensation: Sensation is intact to light touch throughout Motor: Strength is at least antigravity x4.  Movement examination: Tone: There is normal tone in the upper and lower extremities Abnormal movements: None Coordination:  There is no decremation with RAM's Gait and Station: Declines ambulation today (husband states that she is unable to walk at all, but she actually ambulated for me last visit).  I have reviewed and interpreted the following labs independently    Chemistry      Component Value Date/Time   NA 136 08/23/2016 1820   K 4.0 08/23/2016 1820   CL 102 08/23/2016 1820   CO2 28 08/23/2016 1820   BUN 21 (H) 08/23/2016 1820   CREATININE 0.79 08/24/2016 0410      Component Value Date/Time   CALCIUM 9.7 08/23/2016 1820       Lab Results  Component Value Date   WBC 10.6 (H) 08/24/2016   HGB 12.9 08/24/2016   HCT 40.3 08/24/2016   MCV 90.2 08/24/2016   PLT 355 08/24/2016    No results found for: TSH   Total time spent on today's visit was 20 minutes, including both  face-to-face time and nonface-to-face time.  Time included that spent on review of records (prior notes available to me/labs/imaging if pertinent), discussing treatment and goals, answering patient's questions and coordinating care.  Cc:  Josetta Huddle, MD

## 2019-11-11 ENCOUNTER — Encounter: Payer: Self-pay | Admitting: Neurology

## 2019-11-11 ENCOUNTER — Ambulatory Visit (INDEPENDENT_AMBULATORY_CARE_PROVIDER_SITE_OTHER): Payer: Medicare HMO | Admitting: Neurology

## 2019-11-11 ENCOUNTER — Other Ambulatory Visit: Payer: Self-pay

## 2019-11-11 VITALS — BP 191/73 | HR 81 | Ht 64.0 in | Wt 151.0 lb

## 2019-11-11 DIAGNOSIS — F0150 Vascular dementia without behavioral disturbance: Secondary | ICD-10-CM

## 2019-11-11 DIAGNOSIS — R69 Illness, unspecified: Secondary | ICD-10-CM | POA: Diagnosis not present

## 2019-11-11 DIAGNOSIS — G20 Parkinson's disease: Secondary | ICD-10-CM

## 2019-11-11 NOTE — Patient Instructions (Addendum)
1.  Take rytary, 145 mg, 1 three times per day until gone and THEN 2.  Take rytary 195 mg, 1 capsule three times per day.  The physicians and staff at [facility] are committed to providing excellent care. You may receive a survey requesting feedback about your experience at our office. We strive to receive "very good" responses to the survey questions. If you feel that your experience would prevent you from giving the office a "very good " response, please contact our office to try to remedy the situation. We may be reached at [phone]. Thank you for taking the time out of your busy day to complete the survey.

## 2019-12-02 ENCOUNTER — Telehealth: Payer: Self-pay | Admitting: Neurology

## 2019-12-02 NOTE — Telephone Encounter (Signed)
No answer at 9:05am

## 2019-12-02 NOTE — Telephone Encounter (Signed)
I keep getting disconnected.phone line is messing up

## 2019-12-02 NOTE — Telephone Encounter (Signed)
Call pt/husband and find out how they did with higher dosage of rytary and if she could walk yet.

## 2019-12-03 NOTE — Telephone Encounter (Signed)
Multiple attempts to contact patient, will mail letter to contact office.

## 2019-12-03 NOTE — Telephone Encounter (Signed)
Unable to get thru.

## 2019-12-11 ENCOUNTER — Telehealth: Payer: Self-pay | Admitting: Neurology

## 2019-12-11 NOTE — Telephone Encounter (Signed)
Spoke with patients spouse about medication increase. He states he is giving her two in the morning, 1 in the afternoon and 2 in the evening. He states this seems to be working well for the patient. He states patient gives the patient her meds with meals. Reminded him that the patient can not take the medication with protein and he voiced understanding.

## 2019-12-11 NOTE — Telephone Encounter (Signed)
Patient's spouse called in and left a message requesting a call back from a nurse re: "feedback on a letter received about medication."

## 2019-12-30 ENCOUNTER — Other Ambulatory Visit: Payer: Medicare HMO

## 2019-12-30 DIAGNOSIS — E039 Hypothyroidism, unspecified: Secondary | ICD-10-CM | POA: Diagnosis not present

## 2020-01-08 DIAGNOSIS — Z9181 History of falling: Secondary | ICD-10-CM | POA: Diagnosis not present

## 2020-01-08 DIAGNOSIS — E785 Hyperlipidemia, unspecified: Secondary | ICD-10-CM | POA: Diagnosis not present

## 2020-01-08 DIAGNOSIS — Z809 Family history of malignant neoplasm, unspecified: Secondary | ICD-10-CM | POA: Diagnosis not present

## 2020-01-08 DIAGNOSIS — Z823 Family history of stroke: Secondary | ICD-10-CM | POA: Diagnosis not present

## 2020-01-08 DIAGNOSIS — Z87891 Personal history of nicotine dependence: Secondary | ICD-10-CM | POA: Diagnosis not present

## 2020-01-08 DIAGNOSIS — Z008 Encounter for other general examination: Secondary | ICD-10-CM | POA: Diagnosis not present

## 2020-01-08 DIAGNOSIS — Z833 Family history of diabetes mellitus: Secondary | ICD-10-CM | POA: Diagnosis not present

## 2020-01-08 DIAGNOSIS — N3281 Overactive bladder: Secondary | ICD-10-CM | POA: Diagnosis not present

## 2020-01-08 DIAGNOSIS — E039 Hypothyroidism, unspecified: Secondary | ICD-10-CM | POA: Diagnosis not present

## 2020-01-08 DIAGNOSIS — R32 Unspecified urinary incontinence: Secondary | ICD-10-CM | POA: Diagnosis not present

## 2020-01-08 DIAGNOSIS — G20 Parkinson's disease: Secondary | ICD-10-CM | POA: Diagnosis not present

## 2020-01-25 ENCOUNTER — Telehealth: Payer: Self-pay | Admitting: Neurology

## 2020-01-25 MED ORDER — RYTARY 23.75-95 MG PO CPCR: 1.0000 | ORAL_CAPSULE | Freq: Three times a day (TID) | ORAL | 0 refills | Status: DC

## 2020-01-25 NOTE — Telephone Encounter (Signed)
Patients spouse who states the patient is taking Rytary two tablets in the morning, one in the afternoon and one in the evening.   Samples and Rytary patient assistance form placed upfront for patient to complete.   Husband voiced understanding and stated he will come to the office in the morning to complete forms.

## 2020-01-25 NOTE — Telephone Encounter (Signed)
[facility] 01/23/20 @ 11:25AM:  "Caller states his wife is having balance and mobility issues; she has parkinson's. Caller requesting medication.  Nurse assessment: Caller states wife is having balance and mobility problems. She has had Parkinson's for 10 years and ran out of a sample bottle Rytary and needs a new prescription. Having balance issues. It is something that she has had and no new or worsening. It was just getting better with the sample. Husband does not want to discuss any symptoms and says they just want the medicine Rytary.  No new meds called in after hours per the office policy and was explained to husband. He is to call on Monday to ask her Dr about a prescription and will call back for any new or worsening symptoms."

## 2020-01-25 NOTE — Telephone Encounter (Signed)
Spoke with patients spouse who states Dr Tat gave his spouse a sample of Rytary and they are out. Spouse wants to know if the patient can have another sample or if we can send a rx to the pharmacy.

## 2020-01-25 NOTE — Telephone Encounter (Signed)
I believe they are on rytary 195, 2/1/2?  Confirm dose.  Okay to give some samples but start process with [facility]

## 2020-01-26 MED ORDER — RYTARY 48.75-195 MG PO CPCR: 1.0000 | ORAL_CAPSULE | Freq: Three times a day (TID) | ORAL | 0 refills | Status: DC

## 2020-01-26 NOTE — Telephone Encounter (Incomplete)
Patients spouse came to the office to complete Rytary patient assistance forms and pick up Rytary samples.   Patients spouse brought in sample bottle of higher dose than what was confirmed over the phone yesterday.   Reviewed patients chart.  Last note states for patient to start 95mg  of Rytary then increase to 195mg  tid.   Put Rytary 95mg  samples back on shelf and gave patients spouse Rytary 195mg .

## 2020-02-24 DIAGNOSIS — E039 Hypothyroidism, unspecified: Secondary | ICD-10-CM | POA: Diagnosis not present

## 2020-05-10 NOTE — Progress Notes (Signed)
Assessment/Plan:   1. Parkinsonism             -Patient with symptoms since 2011             - Patient under the care of Dr. 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  The issue really was that she could not tolerate levodopa over the years, but really medical records suggested that she had a robust response to levodopa, but could not tolerate it.  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  She has been able to tolerate low-dose Rytary.  she is still not able to walk and wonder if long term deconditioning and gait apraxia are the issue now (no walking x 4-5 years).   Talked with pt/husband about whether or not to continue to increase Rytary or discontinue it.  He chose to discontinue it.  I think it is reasonable.  2.  Parafalcine meningioma             -declines following with Dr. Arlan Organ             -Status post gamma knife surgery in May, 2018             -Last MRI in December, 2019             -Patient's meningioma size currently measures 1.1 x 1.0 cm  -repeat MRI brain with and without at med [facility].   3.  Dementia             -Patient with neurocognitive testing at [facility] in January, 2020.  Husband provides 24 hour/day care.  4.  Sialorrhea             -Not interested in Botox right now.  5.  Dysphagia             -Modified barium swallow in December, 2020 with recommendation for mechanical soft diet with thin liquids.  -pt had difficulty in room coughing on saliva.  Think aspiration risk is at least mod  6.  If MRI stable, f/u q 2years is reasonable.  Subjective:   Elizabeth Bauer was seen today in follow up for Parkinsons disease.  My previous records were reviewed prior to todays visit as well as outside records available to me. Patient's husband with her and supplements the  history. She has not had any falls since last visit. No hallucinations. We were able to get her restarted on Rytary and have now worked her up on the medication. She had run out of the medication and her husband called and stated that she was definitely worse.  Interestingtly, her husband denies that today stating that she is the same and she doesn't walk at all ever.  No SE with the medication.    Current prescribed movement disorder medications: Rytary 195 mg, 2/1/1 (slowly increased from 95 mg 1 capsule three times per day)    ALLERGIES:  No Known Allergies  CURRENT MEDICATIONS:  Outpatient Encounter Medications as of 05/13/2020  Medication Sig  . acetaminophen (TYLENOL) 325 MG tablet Take 2 tablets (650 mg total) by mouth every 4 (four) hours as needed for mild pain.  Marland Kitchen aspirin EC 81 MG tablet Take 81 mg by mouth daily.   Marland Kitchen BIOTIN PO Take 1 tablet by mouth daily.   . cetirizine (ZYRTEC) 10 MG tablet Take 10  mg by mouth daily as needed for allergies or rhinitis.   . cholecalciferol (VITAMIN D) 1000 units tablet Take 1,000 Units by mouth daily.  Marland Kitchen CRANBERRY PO Take 1 tablet by mouth daily.  . Cyanocobalamin (VITAMIN B-12) 5000 MCG TBDP Take 5,000 mcg by mouth daily.  . fluticasone (FLONASE) 50 MCG/ACT nasal spray Place 1-2 sprays into both nostrils daily as needed for allergies or rhinitis.  Marland Kitchen ibuprofen (ADVIL,MOTRIN) 600 MG tablet Take 1 tablet (600 mg total) by mouth every 6 (six) hours as needed for moderate pain.  . Ibuprofen-Diphenhydramine Cit (IBUPROFEN PM PO) Take 1 tablet by mouth at bedtime.  Marland Kitchen levothyroxine (SYNTHROID, LEVOTHROID) 88 MCG tablet Take 88 mcg by mouth daily before breakfast.   . Mirabegron (MYRBETRIQ PO) Take 1 tablet by mouth daily.   . Probiotic CAPS Take 1 capsule by mouth daily. From "[employer]"  . [DISCONTINUED] Carbidopa-Levodopa ER (RYTARY) 48.75-195 MG CPCR Take 1 tablet by mouth in the morning, at noon, and at bedtime.  . [DISCONTINUED] TURMERIC PO Take 1  capsule by mouth daily.  (Patient not taking: Reported on 05/13/2020)   No facility-administered encounter medications on file as of 05/13/2020.    Objective:   PHYSICAL EXAMINATION:    VITALS:   Vitals:   05/13/20 1149  BP: (!) 168/70  Pulse: 79  SpO2: 95%  Weight: 132 lb (59.9 kg)  Height: 5\' 4"  (1.626 m)    GEN:  The patient appears stated age and is in NAD. HEENT:  Normocephalic, atraumatic.  The mucous membranes are moist. The superficial temporal arteries are without ropiness or tenderness. CV:  RRR Lungs:  CTAB Neck/HEME:  There are no carotid bruits bilaterally.  Neurological examination:  Orientation: The patient is alert  Cranial nerves: There is good facial symmetry with facial hypomimia. The speech is fluent and hypophonic. Soft palate rises symmetrically and there is no tongue deviation. Hearing is intact to conversational tone. Sensation: Sensation is intact to light touch throughout Motor: Strength is at least antigravity x4.  Movement examination: Tone: There is nltone in the ue/le Abnormal movements: none Coordination:  There is min decremation with RAM's in the UE Gait and Station: not tested  I have reviewed and interpreted the following labs independently    Chemistry      Component Value Date/Time   NA 136 08/23/2016 1820   K 4.0 08/23/2016 1820   CL 102 08/23/2016 1820   CO2 28 08/23/2016 1820   BUN 21 (H) 08/23/2016 1820   CREATININE 0.79 08/24/2016 0410      Component Value Date/Time   CALCIUM 9.7 08/23/2016 1820       Lab Results  Component Value Date   WBC 10.6 (H) 08/24/2016   HGB 12.9 08/24/2016   HCT 40.3 08/24/2016   MCV 90.2 08/24/2016   PLT 355 08/24/2016    No results found for: TSH   Total time spent on today's visit was 25 minutes, including both face-to-face time and nonface-to-face time.  Time included that spent on review of records (prior notes available to me/labs/imaging if pertinent), discussing treatment  and goals, answering patient's questions and coordinating care.  Cc:  Josetta Huddle, MD

## 2020-05-13 ENCOUNTER — Other Ambulatory Visit: Payer: Self-pay

## 2020-05-13 ENCOUNTER — Encounter: Payer: Self-pay | Admitting: Neurology

## 2020-05-13 ENCOUNTER — Ambulatory Visit: Payer: Medicare HMO | Admitting: Neurology

## 2020-05-13 VITALS — BP 168/70 | HR 79 | Ht 64.0 in | Wt 132.0 lb

## 2020-05-13 DIAGNOSIS — D329 Benign neoplasm of meninges, unspecified: Secondary | ICD-10-CM | POA: Diagnosis not present

## 2020-05-13 DIAGNOSIS — G20 Parkinson's disease: Secondary | ICD-10-CM

## 2020-05-13 NOTE — Patient Instructions (Signed)
[facility] [address], [place], [place] [address]

## 2020-06-04 ENCOUNTER — Ambulatory Visit (HOSPITAL_BASED_OUTPATIENT_CLINIC_OR_DEPARTMENT_OTHER)
Admission: RE | Admit: 2020-06-04 | Discharge: 2020-06-04 | Disposition: A | Discharge status: Home / Self Care | Payer: Medicare HMO | Source: Ambulatory Visit | Attending: Neurology | Admitting: Neurology

## 2020-06-04 ENCOUNTER — Other Ambulatory Visit: Payer: Self-pay

## 2020-06-04 DIAGNOSIS — D329 Benign neoplasm of meninges, unspecified: Secondary | ICD-10-CM | POA: Diagnosis not present

## 2020-06-04 DIAGNOSIS — M2548 Effusion, other site: Secondary | ICD-10-CM | POA: Diagnosis not present

## 2020-06-04 DIAGNOSIS — M26603 Bilateral temporomandibular joint disorder, unspecified: Secondary | ICD-10-CM | POA: Diagnosis not present

## 2020-06-04 DIAGNOSIS — M2669 Other specified disorders of temporomandibular joint: Secondary | ICD-10-CM | POA: Diagnosis not present

## 2020-06-04 DIAGNOSIS — C719 Malignant neoplasm of brain, unspecified: Secondary | ICD-10-CM | POA: Diagnosis not present

## 2020-06-04 MED ORDER — GADOBUTROL 1 MMOL/ML IV SOLN
6.0000 mL | Freq: Once | INTRAVENOUS | Status: AC | PRN
  Administered 2020-06-04: 6 mL via INTRAVENOUS

## 2020-06-13 DIAGNOSIS — B351 Tinea unguium: Secondary | ICD-10-CM | POA: Diagnosis not present

## 2020-06-13 DIAGNOSIS — R269 Unspecified abnormalities of gait and mobility: Secondary | ICD-10-CM | POA: Diagnosis not present

## 2020-06-13 DIAGNOSIS — G20 Parkinson's disease: Secondary | ICD-10-CM | POA: Diagnosis not present

## 2020-06-13 DIAGNOSIS — Z23 Encounter for immunization: Secondary | ICD-10-CM | POA: Diagnosis not present

## 2020-06-13 DIAGNOSIS — E039 Hypothyroidism, unspecified: Secondary | ICD-10-CM | POA: Diagnosis not present

## 2020-08-18 DIAGNOSIS — E039 Hypothyroidism, unspecified: Secondary | ICD-10-CM | POA: Diagnosis not present

## 2020-08-18 DIAGNOSIS — G3184 Mild cognitive impairment, so stated: Secondary | ICD-10-CM | POA: Diagnosis not present

## 2020-08-18 DIAGNOSIS — Z79899 Other long term (current) drug therapy: Secondary | ICD-10-CM | POA: Diagnosis not present

## 2020-08-18 DIAGNOSIS — I739 Peripheral vascular disease, unspecified: Secondary | ICD-10-CM | POA: Diagnosis not present

## 2020-08-18 DIAGNOSIS — R32 Unspecified urinary incontinence: Secondary | ICD-10-CM | POA: Diagnosis not present

## 2020-08-18 DIAGNOSIS — Z7982 Long term (current) use of aspirin: Secondary | ICD-10-CM | POA: Diagnosis not present

## 2020-08-18 DIAGNOSIS — Z008 Encounter for other general examination: Secondary | ICD-10-CM | POA: Diagnosis not present

## 2020-08-18 DIAGNOSIS — G20 Parkinson's disease: Secondary | ICD-10-CM | POA: Diagnosis not present

## 2020-08-18 DIAGNOSIS — J309 Allergic rhinitis, unspecified: Secondary | ICD-10-CM | POA: Diagnosis not present

## 2020-08-18 DIAGNOSIS — Z7409 Other reduced mobility: Secondary | ICD-10-CM | POA: Diagnosis not present

## 2020-08-18 DIAGNOSIS — R2681 Unsteadiness on feet: Secondary | ICD-10-CM | POA: Diagnosis not present

## 2020-08-30 DIAGNOSIS — G20 Parkinson's disease: Secondary | ICD-10-CM | POA: Diagnosis not present

## 2020-08-30 DIAGNOSIS — R69 Illness, unspecified: Secondary | ICD-10-CM | POA: Diagnosis not present

## 2020-08-30 DIAGNOSIS — Z0001 Encounter for general adult medical examination with abnormal findings: Secondary | ICD-10-CM | POA: Diagnosis not present

## 2020-08-30 DIAGNOSIS — R269 Unspecified abnormalities of gait and mobility: Secondary | ICD-10-CM | POA: Diagnosis not present

## 2020-08-30 DIAGNOSIS — E782 Mixed hyperlipidemia: Secondary | ICD-10-CM | POA: Diagnosis not present

## 2020-08-30 DIAGNOSIS — E039 Hypothyroidism, unspecified: Secondary | ICD-10-CM | POA: Diagnosis not present

## 2020-08-30 DIAGNOSIS — E559 Vitamin D deficiency, unspecified: Secondary | ICD-10-CM | POA: Diagnosis not present

## 2020-09-07 DIAGNOSIS — R6889 Other general symptoms and signs: Secondary | ICD-10-CM | POA: Diagnosis not present

## 2020-09-07 DIAGNOSIS — I70201 Unspecified atherosclerosis of native arteries of extremities, right leg: Secondary | ICD-10-CM | POA: Diagnosis not present

## 2020-09-07 DIAGNOSIS — I743 Embolism and thrombosis of arteries of the lower extremities: Secondary | ICD-10-CM | POA: Diagnosis not present

## 2020-10-13 DIAGNOSIS — L600 Ingrowing nail: Secondary | ICD-10-CM | POA: Diagnosis not present

## 2020-10-13 DIAGNOSIS — M79672 Pain in left foot: Secondary | ICD-10-CM | POA: Diagnosis not present

## 2020-11-03 DIAGNOSIS — M71372 Other bursal cyst, left ankle and foot: Secondary | ICD-10-CM | POA: Diagnosis not present

## 2020-11-03 DIAGNOSIS — M7662 Achilles tendinitis, left leg: Secondary | ICD-10-CM | POA: Diagnosis not present

## 2020-11-07 ENCOUNTER — Ambulatory Visit: Payer: Medicare HMO | Admitting: Surgery

## 2020-11-07 ENCOUNTER — Encounter: Payer: Self-pay | Admitting: Surgery

## 2020-11-07 ENCOUNTER — Other Ambulatory Visit (HOSPITAL_COMMUNITY): Payer: Self-pay | Admitting: Surgery

## 2020-11-07 ENCOUNTER — Ambulatory Visit (HOSPITAL_COMMUNITY)
Admission: RE | Admit: 2020-11-07 | Discharge: 2020-11-07 | Disposition: A | Discharge status: Home / Self Care | Payer: Medicare HMO | Source: Ambulatory Visit | Attending: Surgery | Admitting: Surgery

## 2020-11-07 ENCOUNTER — Encounter: Payer: Self-pay | Admitting: *Deleted

## 2020-11-07 ENCOUNTER — Other Ambulatory Visit: Payer: Self-pay | Admitting: *Deleted

## 2020-11-07 ENCOUNTER — Other Ambulatory Visit: Payer: Self-pay

## 2020-11-07 VITALS — BP 171/73 | HR 70 | Temp 98.1°F | Resp 20 | Ht 64.0 in | Wt 132.0 lb

## 2020-11-07 DIAGNOSIS — I739 Peripheral vascular disease, unspecified: Secondary | ICD-10-CM | POA: Insufficient documentation

## 2020-11-07 DIAGNOSIS — I70244 Atherosclerosis of native arteries of left leg with ulceration of heel and midfoot: Secondary | ICD-10-CM | POA: Diagnosis not present

## 2020-11-07 NOTE — H&P (View-Only) (Signed)
 [facility] [place]  Patient name: Elizabeth Bauer MRN: [id number] DOB: 06/20/1941 Sex: female   REQUESTING PROVIDER:    Dr. Gates   REASON FOR CONSULT:    PAD  HISTORY OF PRESENT ILLNESS:   Elizabeth Bauer is a 79 y.o. female, who is Referred for evaluation of PAD.  She recently had a nurse come out and do a circulation test which was abnormal therefore she is referred for formal vascular evaluation.  The patient states that she had a growth under her toe and had her nail removed approximately 2 weeks ago and it has not healed.  She denies any claudication-like symptoms.  The patient is medically managed for hypertension.  She takes a statin for hypercholesterolemia.  She is a former smoker.  She does suffer from Parkinson's.  Most of the dialogue today is via her husband.  PAST MEDICAL HISTORY    Past Medical History:  Diagnosis Date  . Allergy   . Cataract   . Hyperlipidemia   . Parkinson disease (HCC)   . Thyroid disease      FAMILY HISTORY   History reviewed. No pertinent family history.  SOCIAL HISTORY:   Social History   Socioeconomic History  . Marital status: Married    Spouse name: Not on file  . Number of children: Not on file  . Years of education: Not on file  . Highest education level: Not on file  Occupational History  . Not on file  Tobacco Use  . Smoking status: Former Smoker  . Smokeless tobacco: Never Used  Vaping Use  . Vaping Use: Never used  Substance and Sexual Activity  . Alcohol use: Yes    Comment: occasional wine  . Drug use: No  . Sexual activity: Not on file  Other Topics Concern  . Not on file  Social History Narrative  . Not on file   Social Determinants of Health   Financial Resource Strain: Not on file  Food Insecurity: Not on file  Transportation Needs: Not on file  Physical Activity: Not on file  Stress: Not on file  Social Connections: Not on file  Intimate  Partner Violence: Not on file    ALLERGIES:    Allergies  Allergen Reactions  . Cephalexin     Other reaction(s): muscle weakness  . Ciprofloxacin     Other reaction(s): cns side effects/diarrhea  . Other      Ink with [employer]anese Other reaction(s): itchy eyes sneezing itchy nose    CURRENT MEDICATIONS:    Current Outpatient Medications  Medication Sig Dispense Refill  . acetaminophen (TYLENOL) 325 MG tablet Take 2 tablets (650 mg total) by mouth every 4 (four) hours as needed for mild pain.    . aspirin EC 81 MG tablet Take 81 mg by mouth daily.     . atorvastatin (LIPITOR) 10 MG tablet SMARTSIG:1 Tablet(s) By Mouth 4 Times a Week    . BIOTIN PO Take 1 tablet by mouth daily.     . Carbidopa-Levodopa ER (SINEMET CR) 25-100 MG tablet controlled release Take by mouth.    . cholecalciferol (VITAMIN D) 1000 units tablet Take 1,000 Units by mouth daily.    . CRANBERRY PO Take 1 tablet by mouth daily.    . Cyanocobalamin (VITAMIN B-12) 5000 MCG TBDP Take 5,000 mcg by mouth daily.    . fexofenadine (ALLEGRA) 180 MG tablet 1 tablet as needed Orally Once a day for allergy symptoms    . fluticasone (  FLONASE) 50 MCG/ACT nasal spray Place 1-2 sprays into both nostrils daily as needed for allergies or rhinitis.    . ibuprofen (ADVIL,MOTRIN) 600 MG tablet Take 1 tablet (600 mg total) by mouth every 6 (six) hours as needed for moderate pain. 30 tablet 0  . Ibuprofen-Diphenhydramine Cit (IBUPROFEN PM PO) Take 1 tablet by mouth at bedtime.    . levothyroxine (SYNTHROID, LEVOTHROID) 88 MCG tablet Take 88 mcg by mouth daily before breakfast.     . Mirabegron (MYRBETRIQ PO) Take 1 tablet by mouth daily.     . Multiple Vitamin (MULTIVITAMIN ADULT) TABS 1 tablet Orally once a day    . Probiotic CAPS Take 1 capsule by mouth daily. From "[employer]"     No current facility-administered medications for this visit.    REVIEW OF SYSTEMS:   [X] denotes positive finding, [ ] denotes negative  finding Cardiac  Comments:  Chest pain or chest pressure:    Shortness of breath upon exertion:    Short of breath when lying flat:    Irregular heart rhythm:        Vascular    Pain in calf, thigh, or hip brought on by ambulation:    Pain in feet at night that wakes you up from your sleep:     Blood clot in your veins:    Leg swelling:         Pulmonary    Oxygen at home:    Productive cough:     Wheezing:         Neurologic    Sudden weakness in arms or legs:     Sudden numbness in arms or legs:     Sudden onset of difficulty speaking or slurred speech:    Temporary loss of vision in one eye:     Problems with dizziness:         Gastrointestinal    Blood in stool:      Vomited blood:         Genitourinary    Burning when urinating:     Blood in urine:        Psychiatric    Major depression:         Hematologic    Bleeding problems:    Problems with blood clotting too easily:        Skin    Rashes or ulcers:        Constitutional    Fever or chills:     PHYSICAL EXAM:   Vitals:   11/07/20 0957  BP: (!) 171/73  Pulse: 70  Resp: 20  Temp: 98.1 F (36.7 C)  SpO2: 93%  Weight: 132 lb (59.9 kg)  Height: 5' 4" (1.626 m)    GENERAL: The patient is a well-nourished female, in no acute distress. The vital signs are documented above. CARDIAC: There is a regular rate and rhythm.  VASCULAR: Nonpalpable pedal pulses PULMONARY: Nonlabored respirations ABDOMEN: Soft and non-tender with normal pitched bowel sounds.  MUSCULOSKELETAL: There are no major deformities or cyanosis. NEUROLOGIC: No focal weakness or paresthesias are detected. SKIN: Eschar from left toe nail removal PSYCHIATRIC: The patient has a normal affect.  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  Based on the appearance of the wound and the fact that this has not made any progress over the past 2 weeks, coupled with her abnormal vascular lab studies today, despite adequate toe pressures for healing, I think that angiography should be performed to optimize blood flow for wound healing.  This is scheduled for Tuesday, May 24.  The risks and benefits of the procedure were discussed with the patient and her husband.  All questions were answered.   Leia Alf, MD, FACS [facility] [phone] Pager [phone]

## 2020-11-07 NOTE — H&P (View-Only) (Signed)
[facility]  Patient name: Elizabeth Bauer MRN: [id number] DOB: 03-06-1941 Sex: female   REQUESTING PROVIDER:    Dr. Inda Merlin   REASON FOR CONSULT:    PAD  HISTORY OF PRESENT ILLNESS:   Elizabeth Bauer is a 80 y.o. female, who is Referred for evaluation of PAD.  She recently had a nurse come out and do a circulation test which was abnormal therefore she is referred for formal vascular evaluation.  The patient states that she had a growth under her toe and had her nail removed approximately 2 weeks ago and it has not healed.  She denies any claudication-like symptoms.  The patient is medically managed for hypertension.  She takes a statin for hypercholesterolemia.  She is a former smoker.  She does suffer from Parkinson's.  Most of the dialogue today is via her husband.  PAST MEDICAL HISTORY    Past Medical History:  Diagnosis Date  . Allergy   . Cataract   . Hyperlipidemia   . Parkinson disease ([place])   . Thyroid disease      FAMILY HISTORY   History reviewed. No pertinent family history.  SOCIAL HISTORY:   Social History   Socioeconomic History  . Marital status: Married    Spouse name: Not on file  . Number of children: Not on file  . Years of education: Not on file  . Highest education level: Not on file  Occupational History  . Not on file  Tobacco Use  . Smoking status: Former Research scientist (life sciences)  . Smokeless tobacco: Never Used  Vaping Use  . Vaping Use: Never used  Substance and Sexual Activity  . Alcohol use: Yes    Comment: occasional wine  . Drug use: No  . Sexual activity: Not on file  Other Topics Concern  . Not on file  Social History Narrative  . Not on file   Social Determinants of Health   Financial Resource Strain: Not on file  Food Insecurity: Not on file  Transportation Needs: Not on file  Physical Activity: Not on file  Stress: Not on file  Social Connections: Not on file  Intimate  Partner Violence: Not on file    ALLERGIES:    Allergies  Allergen Reactions  . Cephalexin     Other reaction(s): muscle weakness  . Ciprofloxacin     Other reaction(s): cns side effects/diarrhea  . Other      Ink with [employer]anese Other reaction(s): itchy eyes sneezing itchy nose    CURRENT MEDICATIONS:    Current Outpatient Medications  Medication Sig Dispense Refill  . acetaminophen (TYLENOL) 325 MG tablet Take 2 tablets (650 mg total) by mouth every 4 (four) hours as needed for mild pain.    Marland Kitchen aspirin EC 81 MG tablet Take 81 mg by mouth daily.     Marland Kitchen atorvastatin (LIPITOR) 10 MG tablet SMARTSIG:1 Tablet(s) By Mouth 4 Times a Week    . BIOTIN PO Take 1 tablet by mouth daily.     . Carbidopa-Levodopa ER (SINEMET CR) 25-100 MG tablet controlled release Take by mouth.    . cholecalciferol (VITAMIN D) 1000 units tablet Take 1,000 Units by mouth daily.    Marland Kitchen CRANBERRY PO Take 1 tablet by mouth daily.    . Cyanocobalamin (VITAMIN B-12) 5000 MCG TBDP Take 5,000 mcg by mouth daily.    . fexofenadine (ALLEGRA) 180 MG tablet 1 tablet as needed Orally Once a day for allergy symptoms    . fluticasone (  FLONASE) 50 MCG/ACT nasal spray Place 1-2 sprays into both nostrils daily as needed for allergies or rhinitis.    Marland Kitchen ibuprofen (ADVIL,MOTRIN) 600 MG tablet Take 1 tablet (600 mg total) by mouth every 6 (six) hours as needed for moderate pain. 30 tablet 0  . Ibuprofen-Diphenhydramine Cit (IBUPROFEN PM PO) Take 1 tablet by mouth at bedtime.    Marland Kitchen levothyroxine (SYNTHROID, LEVOTHROID) 88 MCG tablet Take 88 mcg by mouth daily before breakfast.     . Mirabegron (MYRBETRIQ PO) Take 1 tablet by mouth daily.     . Multiple Vitamin (MULTIVITAMIN ADULT) TABS 1 tablet Orally once a day    . Probiotic CAPS Take 1 capsule by mouth daily. From "[employer]"     No current facility-administered medications for this visit.    REVIEW OF SYSTEMS:   [X]  denotes positive finding, [ ]  denotes negative  finding Cardiac  Comments:  Chest pain or chest pressure:    Shortness of breath upon exertion:    Short of breath when lying flat:    Irregular heart rhythm:        Vascular    Pain in calf, thigh, or hip brought on by ambulation:    Pain in feet at night that wakes you up from your sleep:     Blood clot in your veins:    Leg swelling:         Pulmonary    Oxygen at home:    Productive cough:     Wheezing:         Neurologic    Sudden weakness in arms or legs:     Sudden numbness in arms or legs:     Sudden onset of difficulty speaking or slurred speech:    Temporary loss of vision in one eye:     Problems with dizziness:         Gastrointestinal    Blood in stool:      Vomited blood:         Genitourinary    Burning when urinating:     Blood in urine:        Psychiatric    Major depression:         Hematologic    Bleeding problems:    Problems with blood clotting too easily:        Skin    Rashes or ulcers:        Constitutional    Fever or chills:     PHYSICAL EXAM:   Vitals:   11/07/20 0957  BP: (!) 171/73  Pulse: 70  Resp: 20  Temp: 98.1 F (36.7 C)  SpO2: 93%  Weight: 132 lb (59.9 kg)  Height: 5\' 4"  (1.626 m)    GENERAL: The patient is a well-nourished female, in no acute distress. The vital signs are documented above. CARDIAC: There is a regular rate and rhythm.  VASCULAR: Nonpalpable pedal pulses PULMONARY: Nonlabored respirations ABDOMEN: Soft and non-tender with normal pitched bowel sounds.  MUSCULOSKELETAL: There are no major deformities or cyanosis. NEUROLOGIC: No focal weakness or paresthesias are detected. SKIN: Eschar from left toe nail removal PSYCHIATRIC: The patient has a normal affect.  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  Based on the appearance of the wound and the fact that this has not made any progress over the past 2 weeks, coupled with her abnormal vascular lab studies today, despite adequate toe pressures for healing, I think that angiography should be performed to optimize blood flow for wound healing.  This is scheduled for Tuesday, May 24.  The risks and benefits of the procedure were discussed with the patient and her husband.  All questions were answered.   Leia Alf, MD, FACS [facility] [phone] Pager [phone]

## 2020-11-07 NOTE — Progress Notes (Signed)
 [facility] [place]  Patient name: Elizabeth Bauer MRN: [id number] DOB: 06/11/1941 Sex: female   REQUESTING PROVIDER:    Dr. Gates   REASON FOR CONSULT:    PAD  HISTORY OF PRESENT ILLNESS:   Elizabeth Bauer is a 79 y.o. female, who is Referred for evaluation of PAD.  She recently had a nurse come out and do a circulation test which was abnormal therefore she is referred for formal vascular evaluation.  The patient states that she had a growth under her toe and had her nail removed approximately 2 weeks ago and it has not healed.  She denies any claudication-like symptoms.  The patient is medically managed for hypertension.  She takes a statin for hypercholesterolemia.  She is a former smoker.  She does suffer from Parkinson's.  Most of the dialogue today is via her husband.  PAST MEDICAL HISTORY    Past Medical History:  Diagnosis Date  . Allergy   . Cataract   . Hyperlipidemia   . Parkinson disease (HCC)   . Thyroid disease      FAMILY HISTORY   History reviewed. No pertinent family history.  SOCIAL HISTORY:   Social History   Socioeconomic History  . Marital status: Married    Spouse name: Not on file  . Number of children: Not on file  . Years of education: Not on file  . Highest education level: Not on file  Occupational History  . Not on file  Tobacco Use  . Smoking status: Former Smoker  . Smokeless tobacco: Never Used  Vaping Use  . Vaping Use: Never used  Substance and Sexual Activity  . Alcohol use: Yes    Comment: occasional wine  . Drug use: No  . Sexual activity: Not on file  Other Topics Concern  . Not on file  Social History Narrative  . Not on file   Social Determinants of Health   Financial Resource Strain: Not on file  Food Insecurity: Not on file  Transportation Needs: Not on file  Physical Activity: Not on file  Stress: Not on file  Social Connections: Not on file  Intimate  Partner Violence: Not on file    ALLERGIES:    Allergies  Allergen Reactions  . Cephalexin     Other reaction(s): muscle weakness  . Ciprofloxacin     Other reaction(s): cns side effects/diarrhea  . Other      Ink with [employer] Other reaction(s): itchy eyes sneezing itchy nose    CURRENT MEDICATIONS:    Current Outpatient Medications  Medication Sig Dispense Refill  . acetaminophen (TYLENOL) 325 MG tablet Take 2 tablets (650 mg total) by mouth every 4 (four) hours as needed for mild pain.    . aspirin EC 81 MG tablet Take 81 mg by mouth daily.     . atorvastatin (LIPITOR) 10 MG tablet SMARTSIG:1 Tablet(s) By Mouth 4 Times a Week    . BIOTIN PO Take 1 tablet by mouth daily.     . Carbidopa-Levodopa ER (SINEMET CR) 25-100 MG tablet controlled release Take by mouth.    . cholecalciferol (VITAMIN D) 1000 units tablet Take 1,000 Units by mouth daily.    . CRANBERRY PO Take 1 tablet by mouth daily.    . Cyanocobalamin (VITAMIN B-12) 5000 MCG TBDP Take 5,000 mcg by mouth daily.    . fexofenadine (ALLEGRA) 180 MG tablet 1 tablet as needed Orally Once a day for allergy symptoms    . fluticasone (  FLONASE) 50 MCG/ACT nasal spray Place 1-2 sprays into both nostrils daily as needed for allergies or rhinitis.    . ibuprofen (ADVIL,MOTRIN) 600 MG tablet Take 1 tablet (600 mg total) by mouth every 6 (six) hours as needed for moderate pain. 30 tablet 0  . Ibuprofen-Diphenhydramine Cit (IBUPROFEN PM PO) Take 1 tablet by mouth at bedtime.    . levothyroxine (SYNTHROID, LEVOTHROID) 88 MCG tablet Take 88 mcg by mouth daily before breakfast.     . Mirabegron (MYRBETRIQ PO) Take 1 tablet by mouth daily.     . Multiple Vitamin (MULTIVITAMIN ADULT) TABS 1 tablet Orally once a day    . Probiotic CAPS Take 1 capsule by mouth daily. From "[employer]"     No current facility-administered medications for this visit.    REVIEW OF SYSTEMS:   [X] denotes positive finding, [ ] denotes negative  finding Cardiac  Comments:  Chest pain or chest pressure:    Shortness of breath upon exertion:    Short of breath when lying flat:    Irregular heart rhythm:        Vascular    Pain in calf, thigh, or hip brought on by ambulation:    Pain in feet at night that wakes you up from your sleep:     Blood clot in your veins:    Leg swelling:         Pulmonary    Oxygen at home:    Productive cough:     Wheezing:         Neurologic    Sudden weakness in arms or legs:     Sudden numbness in arms or legs:     Sudden onset of difficulty speaking or slurred speech:    Temporary loss of vision in one eye:     Problems with dizziness:         Gastrointestinal    Blood in stool:      Vomited blood:         Genitourinary    Burning when urinating:     Blood in urine:        Psychiatric    Major depression:         Hematologic    Bleeding problems:    Problems with blood clotting too easily:        Skin    Rashes or ulcers:        Constitutional    Fever or chills:     PHYSICAL EXAM:   Vitals:   11/07/20 0957  BP: (!) 171/73  Pulse: 70  Resp: 20  Temp: 98.1 F (36.7 C)  SpO2: 93%  Weight: 132 lb (59.9 kg)  Height: 5' 4" (1.626 m)    GENERAL: The patient is a well-nourished female, in no acute distress. The vital signs are documented above. CARDIAC: There is a regular rate and rhythm.  VASCULAR: Nonpalpable pedal pulses PULMONARY: Nonlabored respirations ABDOMEN: Soft and non-tender with normal pitched bowel sounds.  MUSCULOSKELETAL: There are no major deformities or cyanosis. NEUROLOGIC: No focal weakness or paresthesias are detected. SKIN: Eschar from left toe nail removal PSYCHIATRIC: The patient has a normal affect.  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  Based on the appearance of the wound and the fact that this has not made any progress over the past 2 weeks, coupled with her abnormal vascular lab studies today, despite adequate toe pressures for healing, I think that angiography should be performed to optimize blood flow for wound healing.  This is scheduled for Tuesday, May 24.  The risks and benefits of the procedure were discussed with the patient and her husband.  All questions were answered.   Leia Alf, MD, FACS [facility] [phone] Pager [phone]

## 2020-11-10 DIAGNOSIS — G20 Parkinson's disease: Secondary | ICD-10-CM | POA: Diagnosis not present

## 2020-11-10 DIAGNOSIS — D320 Benign neoplasm of cerebral meninges: Secondary | ICD-10-CM | POA: Diagnosis not present

## 2020-11-10 DIAGNOSIS — Z923 Personal history of irradiation: Secondary | ICD-10-CM | POA: Diagnosis not present

## 2020-11-10 DIAGNOSIS — D329 Benign neoplasm of meninges, unspecified: Secondary | ICD-10-CM | POA: Diagnosis not present

## 2020-11-15 ENCOUNTER — Other Ambulatory Visit: Payer: Self-pay

## 2020-11-15 ENCOUNTER — Ambulatory Visit (HOSPITAL_COMMUNITY)
Admission: RE | Admit: 2020-11-15 | Discharge: 2020-11-15 | Disposition: A | Discharge status: Home / Self Care | Payer: Medicare HMO | Attending: Surgery | Admitting: Surgery

## 2020-11-15 ENCOUNTER — Encounter (HOSPITAL_COMMUNITY)
Admission: RE | Disposition: A | Discharge status: Home / Self Care | Payer: Self-pay | Source: Home / Self Care | Attending: Surgery

## 2020-11-15 DIAGNOSIS — Z79899 Other long term (current) drug therapy: Secondary | ICD-10-CM | POA: Insufficient documentation

## 2020-11-15 DIAGNOSIS — I70245 Atherosclerosis of native arteries of left leg with ulceration of other part of foot: Secondary | ICD-10-CM | POA: Insufficient documentation

## 2020-11-15 DIAGNOSIS — Z881 Allergy status to other antibiotic agents status: Secondary | ICD-10-CM | POA: Insufficient documentation

## 2020-11-15 DIAGNOSIS — I10 Essential (primary) hypertension: Secondary | ICD-10-CM | POA: Insufficient documentation

## 2020-11-15 DIAGNOSIS — E7800 Pure hypercholesterolemia, unspecified: Secondary | ICD-10-CM | POA: Diagnosis not present

## 2020-11-15 DIAGNOSIS — Z87891 Personal history of nicotine dependence: Secondary | ICD-10-CM | POA: Diagnosis not present

## 2020-11-15 DIAGNOSIS — E785 Hyperlipidemia, unspecified: Secondary | ICD-10-CM | POA: Insufficient documentation

## 2020-11-15 DIAGNOSIS — I701 Atherosclerosis of renal artery: Secondary | ICD-10-CM | POA: Insufficient documentation

## 2020-11-15 DIAGNOSIS — G20 Parkinson's disease: Secondary | ICD-10-CM | POA: Diagnosis not present

## 2020-11-15 DIAGNOSIS — L97529 Non-pressure chronic ulcer of other part of left foot with unspecified severity: Secondary | ICD-10-CM | POA: Insufficient documentation

## 2020-11-15 DIAGNOSIS — E079 Disorder of thyroid, unspecified: Secondary | ICD-10-CM | POA: Insufficient documentation

## 2020-11-15 DIAGNOSIS — Z7982 Long term (current) use of aspirin: Secondary | ICD-10-CM | POA: Diagnosis not present

## 2020-11-15 DIAGNOSIS — Z79890 Hormone replacement therapy (postmenopausal): Secondary | ICD-10-CM | POA: Insufficient documentation

## 2020-11-15 DIAGNOSIS — H269 Unspecified cataract: Secondary | ICD-10-CM | POA: Insufficient documentation

## 2020-11-15 HISTORY — PX: PERIPHERAL VASCULAR BALLOON ANGIOPLASTY: CATH118281

## 2020-11-15 HISTORY — PX: ABDOMINAL AORTOGRAM W/LOWER EXTREMITY: CATH118223

## 2020-11-15 LAB — POCT I-STAT, CHEM 8
BUN: 21 mg/dL (ref 8–23)
Calcium, Ion: 1.37 mmol/L (ref 1.15–1.40)
Chloride: 104 mmol/L (ref 98–111)
Creatinine, Ser: 0.7 mg/dL (ref 0.44–1.00)
Glucose, Bld: 95 mg/dL (ref 70–99)
HCT: 37 % (ref 36.0–46.0)
Hemoglobin: 12.6 g/dL (ref 12.0–15.0)
Potassium: 3.9 mmol/L (ref 3.5–5.1)
Sodium: 143 mmol/L (ref 135–145)
TCO2: 29 mmol/L (ref 22–32)

## 2020-11-15 LAB — POCT ACTIVATED CLOTTING TIME: Activated Clotting Time: 214 seconds

## 2020-11-15 SURGERY — ABDOMINAL AORTOGRAM W/LOWER EXTREMITY
Anesthesia: LOCAL

## 2020-11-15 MED ORDER — SODIUM CHLORIDE 0.9 % IV SOLN: 250.0000 mL | INTRAVENOUS | Status: DC | PRN

## 2020-11-15 MED ORDER — MIDAZOLAM HCL 2 MG/2ML IJ SOLN
INTRAMUSCULAR | Status: DC | PRN
  Administered 2020-11-15: 2 mg via INTRAVENOUS

## 2020-11-15 MED ORDER — SODIUM CHLORIDE 0.9% FLUSH: 3.0000 mL | Freq: Two times a day (BID) | INTRAVENOUS | Status: DC

## 2020-11-15 MED ORDER — FENTANYL CITRATE (PF) 100 MCG/2ML IJ SOLN
INTRAMUSCULAR | Status: DC | PRN
  Administered 2020-11-15: 50 ug via INTRAVENOUS

## 2020-11-15 MED ORDER — HEPARIN (PORCINE) IN NACL 1000-0.9 UT/500ML-% IV SOLN
INTRAVENOUS | Status: DC | PRN
  Administered 2020-11-15 (×2): 500 mL

## 2020-11-15 MED ORDER — LIDOCAINE HCL (PF) 1 % IJ SOLN
INTRAMUSCULAR | Status: AC
  Filled 2020-11-15: qty 30

## 2020-11-15 MED ORDER — ACETAMINOPHEN 325 MG PO TABS: 650.0000 mg | ORAL_TABLET | ORAL | Status: DC | PRN

## 2020-11-15 MED ORDER — HEPARIN SODIUM (PORCINE) 1000 UNIT/ML IJ SOLN
INTRAMUSCULAR | Status: DC | PRN
  Administered 2020-11-15: 5000 [IU] via INTRAVENOUS
  Administered 2020-11-15: 1000 [IU] via INTRAVENOUS

## 2020-11-15 MED ORDER — IODIXANOL 320 MG/ML IV SOLN
INTRAVENOUS | Status: DC | PRN
  Administered 2020-11-15: 115 mL via INTRA_ARTERIAL

## 2020-11-15 MED ORDER — ONDANSETRON HCL 4 MG/2ML IJ SOLN: 4.0000 mg | Freq: Four times a day (QID) | INTRAMUSCULAR | Status: DC | PRN

## 2020-11-15 MED ORDER — MORPHINE SULFATE (PF) 2 MG/ML IV SOLN
2.0000 mg | INTRAVENOUS | Status: DC | PRN
Start: 2020-11-15 — End: 2020-11-15

## 2020-11-15 MED ORDER — HYDRALAZINE HCL 20 MG/ML IJ SOLN
INTRAMUSCULAR | Status: DC | PRN
  Administered 2020-11-15: 10 mg via INTRAVENOUS

## 2020-11-15 MED ORDER — HEPARIN (PORCINE) IN NACL 1000-0.9 UT/500ML-% IV SOLN
INTRAVENOUS | Status: AC
  Filled 2020-11-15: qty 1000

## 2020-11-15 MED ORDER — SODIUM CHLORIDE 0.9 % WEIGHT BASED INFUSION: 1.0000 mL/kg/h | INTRAVENOUS | Status: DC

## 2020-11-15 MED ORDER — HYDRALAZINE HCL 20 MG/ML IJ SOLN: 5.0000 mg | INTRAMUSCULAR | Status: DC | PRN

## 2020-11-15 MED ORDER — HYDRALAZINE HCL 20 MG/ML IJ SOLN
INTRAMUSCULAR | Status: AC
  Filled 2020-11-15: qty 1

## 2020-11-15 MED ORDER — LIDOCAINE HCL (PF) 1 % IJ SOLN
INTRAMUSCULAR | Status: DC | PRN
  Administered 2020-11-15: 15 mL via INTRADERMAL

## 2020-11-15 MED ORDER — FENTANYL CITRATE (PF) 100 MCG/2ML IJ SOLN
INTRAMUSCULAR | Status: AC
  Filled 2020-11-15: qty 2

## 2020-11-15 MED ORDER — MIDAZOLAM HCL 2 MG/2ML IJ SOLN
INTRAMUSCULAR | Status: AC
  Filled 2020-11-15: qty 2

## 2020-11-15 MED ORDER — OXYCODONE HCL 5 MG PO TABS: 5.0000 mg | ORAL_TABLET | ORAL | Status: DC | PRN

## 2020-11-15 MED ORDER — SODIUM CHLORIDE 0.9% FLUSH: 3.0000 mL | INTRAVENOUS | Status: DC | PRN

## 2020-11-15 MED ORDER — LABETALOL HCL 5 MG/ML IV SOLN: 10.0000 mg | INTRAVENOUS | Status: DC | PRN

## 2020-11-15 MED ORDER — HEPARIN SODIUM (PORCINE) 1000 UNIT/ML IJ SOLN
INTRAMUSCULAR | Status: AC
  Filled 2020-11-15: qty 1

## 2020-11-15 MED ORDER — SODIUM CHLORIDE 0.9 % IV SOLN: INTRAVENOUS | Status: DC

## 2020-11-15 SURGICAL SUPPLY — 19 items
CATH NAVICROSS ANGLED 90CM (MICROCATHETER) ×3 IMPLANT
CATH NAVICROSS ST .035X90CM (MICROCATHETER) ×3 IMPLANT
CATH OMNI FLUSH 5F 65CM (CATHETERS) ×3 IMPLANT
CATH QUICKCROSS SUPP .035X90CM (MICROCATHETER) ×3 IMPLANT
DEVICE TORQUE H2O (MISCELLANEOUS) ×3 IMPLANT
DEVICE VASC CLSR CELT ART 6 (Vascular Products) ×3 IMPLANT
GUIDEWIRE ANGLED .035X150CM (WIRE) ×6 IMPLANT
KIT MICROPUNCTURE NIT STIFF (SHEATH) ×3 IMPLANT
KIT PV (KITS) ×3 IMPLANT
SHEATH PINNACLE 5F 10CM (SHEATH) ×3 IMPLANT
SHEATH PINNACLE 6F 10CM (SHEATH) ×3 IMPLANT
SHEATH PINNACLE MP 6F 45CM (SHEATH) ×3 IMPLANT
SHEATH PROBE COVER 6X72 (BAG) ×6 IMPLANT
SYR MEDRAD MARK V 150ML (SYRINGE) ×3 IMPLANT
TAPE VIPERTRACK RADIOPAQ (MISCELLANEOUS) ×2 IMPLANT
TAPE VIPERTRACK RADIOPAQUE (MISCELLANEOUS) ×3
TRANSDUCER W/STOPCOCK (MISCELLANEOUS) ×3 IMPLANT
TRAY PV CATH (CUSTOM PROCEDURE TRAY) ×3 IMPLANT
WIRE BENTSON .035X145CM (WIRE) ×3 IMPLANT

## 2020-11-15 NOTE — Interval H&P Note (Signed)
History and Physical Interval Note:  11/15/2020 11:47 AM  Elizabeth Bauer  has presented today for surgery, with the diagnosis of pad.  The various methods of treatment have been discussed with the patient and family. After consideration of risks, benefits and other options for treatment, the patient has consented to  Procedure(s): ABDOMINAL AORTOGRAM W/LOWER EXTREMITY (N/A) as a surgical intervention.  The patient's history has been reviewed, patient examined, no change in status, stable for surgery.  I have reviewed the patient's chart and labs.  Questions were answered to the patient's satisfaction.     [name]

## 2020-11-15 NOTE — Discharge Instructions (Signed)
Femoral Site Care  This sheet gives you information about how to care for yourself after your procedure. Your health care provider may also give you more specific instructions. If you have problems or questions, contact your health care provider. What can I expect after the procedure? After the procedure, it is common to have:  Bruising that usually fades within 1-2 weeks.  Tenderness at the site. Follow these instructions at home: Wound care  Follow instructions from your health care provider about how to take care of your insertion site. Make sure you: ? Wash your hands with soap and water before you change your bandage (dressing). If soap and water are not available, use hand sanitizer. ? Change your dressing as told by your health care provider. ? Leave stitches (sutures), skin glue, or adhesive strips in place. These skin closures may need to stay in place for 2 weeks or longer. If adhesive strip edges start to loosen and curl up, you may trim the loose edges. Do not remove adhesive strips completely unless your health care provider tells you to do that.  Do not take baths, swim, or use a hot tub until your health care provider approves.  You may shower 24-48 hours after the procedure or as told by your health care provider. ? Gently wash the site with plain soap and water. ? Pat the area dry with a clean towel. ? Do not rub the site. This may cause bleeding.  Do not apply powder or lotion to the site. Keep the site clean and dry.  Check your femoral site every day for signs of infection. Check for: ? Redness, swelling, or pain. ? Fluid or blood. ? Warmth. ? Pus or a bad smell. Activity  For the first 2-3 days after your procedure, or as long as directed: ? Avoid climbing stairs as much as possible. ? Do not squat.  Do not lift anything that is heavier than 10 lb (4.5 kg), or the limit that you are told, until your health care provider says that it is safe.  Rest as  directed. ? Avoid sitting for a long time without moving. Get up to take short walks every 1-2 hours.  Do not drive for 24 hours if you were given a medicine to help you relax (sedative). General instructions  Take over-the-counter and prescription medicines only as told by your health care provider.  Keep all follow-up visits as told by your health care provider. This is important. Contact a health care provider if you have:  A fever or chills.  You have redness, swelling, or pain around your insertion site. Get help right away if:  The catheter insertion area swells very fast.  You pass out.  You suddenly start to sweat or your skin gets clammy.  The catheter insertion area is bleeding, and the bleeding does not stop when you hold steady pressure on the area.  The area near or just beyond the catheter insertion site becomes pale, cool, tingly, or numb. These symptoms may represent a serious problem that is an emergency. Do not wait to see if the symptoms will go away. Get medical help right away. Call your local emergency services (911 in the [place] Do not drive yourself to the hospital. Summary  After the procedure, it is common to have bruising that usually fades within 1-2 weeks.  Check your femoral site every day for signs of infection.  Do not lift anything that is heavier than 10 lb (4.5 kg), or   the limit that you are told, until your health care provider says that it is safe. This information is not intended to replace advice given to you by your health care provider. Make sure you discuss any questions you have with your health care provider. Document Revised: 02/12/2020 Document Reviewed: 02/12/2020 Elsevier Patient Education  2021 Elsevier Inc.  

## 2020-11-15 NOTE — Op Note (Signed)
Keep Premarin placed   Patient name: Elizabeth Bauer MRN: [id number] DOB: Oct 26, 1940 Sex: female  11/15/2020 Pre-operative Diagnosis: Left toe ulcer Post-operative diagnosis:  Same Surgeon:  [name] Procedure Performed:  1.  Ultrasound-guided access, right femoral artery  2.  Abdominal aortogram  3.  Left lower extremity runoff  4.  Third order catheterization  5.  Failed angioplasty left superficial femoral artery  6.  Conscious sedation, 74 minutes  7.  Closure device, Celt   Indications: This is a 80 year old female with nonhealing wound to her left toe.  She comes in today for a evaluation and possible invention  Procedure:  The patient was identified in the holding area and taken to room 8.  The patient was then placed supine on the table and prepped and draped in the usual sterile fashion.  A time out was called.  Conscious sedation was administered with the use of IV fentanyl and Versed under continuous physician and nurse monitoring.  Heart rate, blood pressure, and oxygen saturation were continuously monitored.  Total sedation time was 74 minutes.  Ultrasound was used to evaluate the right common femoral artery.  It was patent .  A digital ultrasound image was acquired.  A micropuncture needle was used to access the right common femoral artery under ultrasound guidance.  An 018 wire was advanced without resistance and a micropuncture sheath was placed.  The 018 wire was removed and a benson wire was placed.  The micropuncture sheath was exchanged for a 5 french sheath.  An omniflush catheter was advanced over the wire to the level of L-1.  An abdominal angiogram was obtained.  Next, using the omniflush catheter and a benson wire, the aortic bifurcation was crossed and the catheter was placed into theleft external iliac artery and left runoff was obtained.    Findings:   Aortogram: Approximately 60 to 70% right renal artery stenosis was identified.  The left renal artery is widely  patent.  The infrarenal abdominal aorta is widely patent without significant stenosis as are bilateral common and external iliac arteries  Right Lower Extremity: Not evaluated  Left Lower Extremity: The left common femoral profundofemoral artery are small in caliber but patent without significant stenosis.  The superficial femoral artery occludes for a short segment just proximal to the adductor canal with reconstitution of the above-knee popliteal artery.  There is two-vessel runoff via the posterior tibial and peroneal artery  Intervention: After the above images were acquired the decision made to proceed with intervention.  A 6 French 45 cm sheath was advanced into the left superficial femoral artery.  I then attempted to cross the occlusion using a 035 Glidewire and a quick cross catheter.  I was unable to gain reentry I tried multiple guide catheters but was ultimately not successful.  At the end the cath on going into the dissection plane.  I felt it was best to stop.  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  #3  Unsuccessful attempt at recanalization of the short segment occlusion  #4  The patient will be brought back for a second attempt at recanalization once the dissection plane closes off.  Because  of the size of her posterior tibial artery, she is not a great pedal access candidate.  If she fails a second attempt at cannulization, I will likely need to consider bypass graft for limb salvage     V. [name], M.D., [facility] of [place] Office: [phone] Pager:  [phone]

## 2020-11-16 ENCOUNTER — Encounter (HOSPITAL_COMMUNITY): Payer: Self-pay | Admitting: Surgery

## 2020-11-17 ENCOUNTER — Encounter (HOSPITAL_COMMUNITY): Payer: Self-pay | Admitting: Surgery

## 2020-11-17 ENCOUNTER — Other Ambulatory Visit: Payer: Self-pay

## 2020-11-30 DIAGNOSIS — M25511 Pain in right shoulder: Secondary | ICD-10-CM | POA: Diagnosis not present

## 2020-11-30 DIAGNOSIS — M7581 Other shoulder lesions, right shoulder: Secondary | ICD-10-CM | POA: Diagnosis not present

## 2020-12-06 ENCOUNTER — Encounter (HOSPITAL_COMMUNITY): Payer: Self-pay | Admitting: Surgery

## 2020-12-06 ENCOUNTER — Ambulatory Visit (HOSPITAL_COMMUNITY)
Admission: RE | Disposition: A | Discharge status: Home / Self Care | Payer: Self-pay | Source: Home / Self Care | Attending: Surgery

## 2020-12-06 ENCOUNTER — Other Ambulatory Visit: Payer: Self-pay

## 2020-12-06 ENCOUNTER — Ambulatory Visit (HOSPITAL_COMMUNITY)
Admission: RE | Admit: 2020-12-06 | Discharge: 2020-12-06 | Disposition: A | Discharge status: Home / Self Care | Payer: Medicare HMO | Attending: Surgery | Admitting: Surgery

## 2020-12-06 DIAGNOSIS — Z79890 Hormone replacement therapy (postmenopausal): Secondary | ICD-10-CM | POA: Insufficient documentation

## 2020-12-06 DIAGNOSIS — I70245 Atherosclerosis of native arteries of left leg with ulceration of other part of foot: Secondary | ICD-10-CM | POA: Diagnosis present

## 2020-12-06 DIAGNOSIS — Z79899 Other long term (current) drug therapy: Secondary | ICD-10-CM | POA: Insufficient documentation

## 2020-12-06 DIAGNOSIS — H269 Unspecified cataract: Secondary | ICD-10-CM | POA: Diagnosis not present

## 2020-12-06 DIAGNOSIS — Z87891 Personal history of nicotine dependence: Secondary | ICD-10-CM | POA: Diagnosis not present

## 2020-12-06 DIAGNOSIS — E785 Hyperlipidemia, unspecified: Secondary | ICD-10-CM | POA: Insufficient documentation

## 2020-12-06 DIAGNOSIS — I10 Essential (primary) hypertension: Secondary | ICD-10-CM | POA: Insufficient documentation

## 2020-12-06 DIAGNOSIS — G20 Parkinson's disease: Secondary | ICD-10-CM | POA: Diagnosis not present

## 2020-12-06 DIAGNOSIS — L97529 Non-pressure chronic ulcer of other part of left foot with unspecified severity: Secondary | ICD-10-CM | POA: Insufficient documentation

## 2020-12-06 DIAGNOSIS — E079 Disorder of thyroid, unspecified: Secondary | ICD-10-CM | POA: Insufficient documentation

## 2020-12-06 DIAGNOSIS — Z881 Allergy status to other antibiotic agents status: Secondary | ICD-10-CM | POA: Insufficient documentation

## 2020-12-06 DIAGNOSIS — Z7982 Long term (current) use of aspirin: Secondary | ICD-10-CM | POA: Diagnosis not present

## 2020-12-06 HISTORY — PX: PERIPHERAL VASCULAR INTERVENTION: CATH118257

## 2020-12-06 LAB — POCT I-STAT, CHEM 8
BUN: 27 mg/dL — ABNORMAL HIGH (ref 8–23)
Calcium, Ion: 1.36 mmol/L (ref 1.15–1.40)
Chloride: 104 mmol/L (ref 98–111)
Creatinine, Ser: 0.8 mg/dL (ref 0.44–1.00)
Glucose, Bld: 95 mg/dL (ref 70–99)
HCT: 39 % (ref 36.0–46.0)
Hemoglobin: 13.3 g/dL (ref 12.0–15.0)
Potassium: 3.6 mmol/L (ref 3.5–5.1)
Sodium: 142 mmol/L (ref 135–145)
TCO2: 30 mmol/L (ref 22–32)

## 2020-12-06 LAB — POCT ACTIVATED CLOTTING TIME
Activated Clotting Time: 202 seconds
Activated Clotting Time: 225 seconds

## 2020-12-06 SURGERY — PERIPHERAL VASCULAR INTERVENTION
Anesthesia: LOCAL

## 2020-12-06 MED ORDER — MIDAZOLAM HCL 2 MG/2ML IJ SOLN
INTRAMUSCULAR | Status: DC | PRN
  Administered 2020-12-06: 1 mg via INTRAVENOUS

## 2020-12-06 MED ORDER — ONDANSETRON HCL 4 MG/2ML IJ SOLN: 4.0000 mg | Freq: Four times a day (QID) | INTRAMUSCULAR | Status: DC | PRN

## 2020-12-06 MED ORDER — CLOPIDOGREL BISULFATE 75 MG PO TABS: 300.0000 mg | ORAL_TABLET | Freq: Once | ORAL | Status: DC

## 2020-12-06 MED ORDER — HEPARIN SODIUM (PORCINE) 1000 UNIT/ML IJ SOLN
INTRAMUSCULAR | Status: AC
  Filled 2020-12-06: qty 1

## 2020-12-06 MED ORDER — LIDOCAINE HCL (PF) 1 % IJ SOLN
INTRAMUSCULAR | Status: AC
  Filled 2020-12-06: qty 30

## 2020-12-06 MED ORDER — HEPARIN (PORCINE) IN NACL 1000-0.9 UT/500ML-% IV SOLN
INTRAVENOUS | Status: AC
  Filled 2020-12-06: qty 500

## 2020-12-06 MED ORDER — CLOPIDOGREL BISULFATE 75 MG PO TABS
ORAL_TABLET | ORAL | Status: AC
  Filled 2020-12-06: qty 1

## 2020-12-06 MED ORDER — HEPARIN SODIUM (PORCINE) 1000 UNIT/ML IJ SOLN
INTRAMUSCULAR | Status: DC | PRN
  Administered 2020-12-06: 2000 [IU] via INTRAVENOUS
  Administered 2020-12-06: 6000 [IU] via INTRAVENOUS

## 2020-12-06 MED ORDER — SODIUM CHLORIDE 0.9 % IV SOLN: INTRAVENOUS | Status: DC

## 2020-12-06 MED ORDER — LIDOCAINE HCL (PF) 1 % IJ SOLN
INTRAMUSCULAR | Status: DC | PRN
  Administered 2020-12-06: 15 mL
  Administered 2020-12-06: 2 mL

## 2020-12-06 MED ORDER — MIDAZOLAM HCL 5 MG/5ML IJ SOLN
INTRAMUSCULAR | Status: AC
  Filled 2020-12-06: qty 5

## 2020-12-06 MED ORDER — CLOPIDOGREL BISULFATE 300 MG PO TABS
ORAL_TABLET | ORAL | Status: DC | PRN
  Administered 2020-12-06: 300 mg via ORAL

## 2020-12-06 MED ORDER — HEPARIN (PORCINE) IN NACL 1000-0.9 UT/500ML-% IV SOLN
INTRAVENOUS | Status: DC | PRN
  Administered 2020-12-06 (×2): 500 mL

## 2020-12-06 MED ORDER — FENTANYL CITRATE (PF) 100 MCG/2ML IJ SOLN
INTRAMUSCULAR | Status: DC | PRN
  Administered 2020-12-06: 25 ug via INTRAVENOUS

## 2020-12-06 MED ORDER — CLOPIDOGREL BISULFATE 75 MG PO TABS: 75.0000 mg | ORAL_TABLET | Freq: Every day | ORAL | 11 refills | Status: AC

## 2020-12-06 MED ORDER — SODIUM CHLORIDE 0.9 % WEIGHT BASED INFUSION: 1.0000 mL/kg/h | INTRAVENOUS | Status: DC

## 2020-12-06 MED ORDER — HYDRALAZINE HCL 20 MG/ML IJ SOLN: 5.0000 mg | INTRAMUSCULAR | Status: DC | PRN

## 2020-12-06 MED ORDER — IODIXANOL 320 MG/ML IV SOLN
INTRAVENOUS | Status: DC | PRN
  Administered 2020-12-06: 125 mL via INTRA_ARTERIAL

## 2020-12-06 MED ORDER — ACETAMINOPHEN 325 MG PO TABS: 650.0000 mg | ORAL_TABLET | ORAL | Status: DC | PRN

## 2020-12-06 MED ORDER — SODIUM CHLORIDE 0.9% FLUSH: 3.0000 mL | Freq: Two times a day (BID) | INTRAVENOUS | Status: DC

## 2020-12-06 MED ORDER — CLOPIDOGREL BISULFATE 75 MG PO TABS: 75.0000 mg | ORAL_TABLET | Freq: Every day | ORAL | Status: DC

## 2020-12-06 MED ORDER — SODIUM CHLORIDE 0.9 % IV SOLN: 250.0000 mL | INTRAVENOUS | Status: DC | PRN

## 2020-12-06 MED ORDER — LABETALOL HCL 5 MG/ML IV SOLN
10.0000 mg | INTRAVENOUS | Status: DC | PRN
  Filled 2020-12-06: qty 4

## 2020-12-06 MED ORDER — FENTANYL CITRATE (PF) 100 MCG/2ML IJ SOLN
INTRAMUSCULAR | Status: AC
  Filled 2020-12-06: qty 2

## 2020-12-06 MED ORDER — SODIUM CHLORIDE 0.9% FLUSH: 3.0000 mL | INTRAVENOUS | Status: DC | PRN

## 2020-12-06 SURGICAL SUPPLY — 25 items
BALLN MUSTANG 5X100X135 (BALLOONS) ×3
BALLOON MUSTANG 5X100X135 (BALLOONS) ×2 IMPLANT
CATH CXI 2.6F 65 ST (CATHETERS) ×3
CATH OMNI FLUSH 5F 65CM (CATHETERS) ×3 IMPLANT
CATH QUICKCROSS SUPP .035X90CM (MICROCATHETER) ×3 IMPLANT
CATH SPRT STRG 65X2.6FR ACPT (CATHETERS) ×2 IMPLANT
DEVICE ONE SNARE 10MM (MISCELLANEOUS) ×3 IMPLANT
DEVICE TORQUE H2O (MISCELLANEOUS) ×3 IMPLANT
DEVICE VASC CLSR CELT ART 6 (Vascular Products) ×3 IMPLANT
GUIDEWIRE ANGLED .035X150CM (WIRE) ×3 IMPLANT
KIT ENCORE 26 ADVANTAGE (KITS) ×3 IMPLANT
KIT MICROPUNCTURE NIT STIFF (SHEATH) ×3 IMPLANT
KIT PV (KITS) ×3 IMPLANT
PATCH THROMBIX TOPICAL PLAIN (HEMOSTASIS) ×3 IMPLANT
SHEATH FLEX ANSEL ANG 6F 45CM (SHEATH) ×3 IMPLANT
SHEATH MICROPUNCTURE PEDAL 4FR (SHEATH) ×3 IMPLANT
SHEATH PINNACLE 5F 10CM (SHEATH) ×3 IMPLANT
SHEATH PINNACLE 6F 10CM (SHEATH) ×3 IMPLANT
SHEATH PROBE COVER 6X72 (BAG) ×3 IMPLANT
STENT ELUVIA 6X120X130 (Permanent Stent) ×3 IMPLANT
STENT ELUVIA 6X80X130 (Permanent Stent) ×3 IMPLANT
TRANSDUCER W/STOPCOCK (MISCELLANEOUS) ×3 IMPLANT
TRAY PV CATH (CUSTOM PROCEDURE TRAY) ×3 IMPLANT
WIRE G V18X300CM (WIRE) ×6 IMPLANT
WIRE STARTER BENTSON 035X150 (WIRE) ×3 IMPLANT

## 2020-12-06 NOTE — Interval H&P Note (Signed)
History and Physical Interval Note:  12/06/2020 11:58 AM  Elizabeth Bauer  has presented today for surgery, with the diagnosis of L toe ulcer.  The various methods of treatment have been discussed with the patient and family. After consideration of risks, benefits and other options for treatment, the patient has consented to  Procedure(s) with comments: PERIPHERAL VASCULAR INTERVENTION (Left) - superficial femoral as a surgical intervention.  The patient's history has been reviewed, patient examined, no change in status, stable for surgery.  I have reviewed the patient's chart and labs.  Questions were answered to the patient's satisfaction.     [name]

## 2020-12-06 NOTE — Op Note (Signed)
    Patient name: Terrika Zuver MRN: [id number] DOB: 11-Feb-1941 Sex: female  12/06/2020 Pre-operative Diagnosis: left foot u lcer Post-operative diagnosis:  Same Surgeon:  [name] Procedure Performed:  1.  U/s guided access right femoral artery  2.  U/s guided access left posterior tibial artery  3.  Stent Left SFA  4.  Conscious sedation,87 minutes  5.  Closure device (Celt)   Indications:  80 year old with non-healing left toe wound  Procedure:  The patient was identified in the holding area and taken to room 8.  The patient was then placed supine on the table and prepped and draped in the usual sterile fashion.  A time out was called.  Conscious sedation was administered with the use of IV fentanyl and Versed under continuous physician and nurse monitoring.  Heart rate, blood pressure, and oxygen saturation were continuously monitored.  Total sedation time was 87 minutes.  Ultrasound was used to evaluate the right common femoral artery.  It was patent .  A digital ultrasound image was acquired.  A micropuncture needle was used to access the right common femoral artery under ultrasound guidance.  An 018 wire was advanced without resistance and a micropuncture sheath was placed.  The 018 wire was removed and a benson wire was placed.  The micropuncture sheath was exchanged for a 5 french sheath.  An omniflush catheter was inserted and used to cross the aortic bifurcation.  Next, a 7 French sheath was advanced into the left common femoral artery.  The patient was fully heparinized.  Left leg runoff images were acquired showing that the dissection plane had healed.  There was runoff through the peroneal and posterior tibial artery.  I then tried to cross the lesion again with a 035 Glidewire and a quick cross catheter.  Unfortunately this ended up in a dissection plane and I could not cross the lesion.  At this point pedal access was performed by cannulation of the posterior tibial artery under  ultrasound guidance.  A slender sheath was placed followed by a V-18 wire which was able to be advanced through the lesion with the support of a CXI catheter.  The 018 wire was then snared with a 10 mm snare and brought out through the sheath in the right groin.  I then predilated the occlusion with a 5 mm balloon and then placed a 6 x 120 followed by a 6 x 80 Elluvia to treat the lesion.  These were postdilated with a 5 mm balloon.  Completion imaging showed resolution of the stenosis.  There was a size mismatch below the stents that I did not want to treat.  Runoff was unchanged.  The groin was closed with a Celt    Impression:  #1  Successful stenting of an occluded left superficial femoral artery, requiring pedal access via the posterior tibial artery.  6 mm stents were placed.    Theotis Burrow, M.D., [facility] [place] Office: [phone] Pager:  [phone]

## 2020-12-07 ENCOUNTER — Other Ambulatory Visit: Payer: Self-pay | Admitting: *Deleted

## 2020-12-07 ENCOUNTER — Encounter (HOSPITAL_COMMUNITY): Payer: Self-pay | Admitting: Surgery

## 2020-12-07 DIAGNOSIS — I70244 Atherosclerosis of native arteries of left leg with ulceration of heel and midfoot: Secondary | ICD-10-CM

## 2020-12-07 MED FILL — Clopidogrel Bisulfate Tab 75 MG (Base Equiv): ORAL | Qty: 4 | Status: AC

## 2020-12-23 ENCOUNTER — Telehealth: Payer: Self-pay | Admitting: Neurology

## 2020-12-23 NOTE — Telephone Encounter (Signed)
Called patients husband and informed him per Dr. Carles Collet that patient was evaluated with neurocog testing at [facility] in 2020 prior to seeing me for dementia and dx with that there.Patients husband asked we could get her information from there for him or see if they can see her again. I informed patients husband that he will need to contact them in regards to her testing there from 2020. Patients husband verbalized understanding and will give them a call.

## 2020-12-23 NOTE — Telephone Encounter (Signed)
Pt's husband called in wanting to speak with Dr. Carles Collet about how his wife might be evaluated for dementia.

## 2020-12-23 NOTE — Telephone Encounter (Signed)
She was evaluated with neurocog testing at [facility] in 2020 prior to seeing me for dementia and dx with that there.

## 2021-01-05 ENCOUNTER — Telehealth: Payer: Self-pay | Admitting: Neurology

## 2021-01-05 NOTE — Telephone Encounter (Signed)
I spoke with pt husband and read phone note from July 1st where pt had neuro eval in 2020 he was asking for the notes he was informed that I do not have the records I was reading from a phone note where he had talked to our staff on December 23 2020. He stated thank you for the help and asked for Dr Tat to call him when she returned from vacation,

## 2021-01-05 NOTE — Telephone Encounter (Signed)
Pt husband wants to find out why tat said she couldn't further assist her. He said his wife is having memory/mental issues. He would like paperwork regarding her condition. Please call  him at [phone]

## 2021-01-06 NOTE — Telephone Encounter (Signed)
Called and spoke to patients husband and informed him per Dr. Carles Collet that there must have been a misunderstanding in regards to his call because Dr. Carles Collet was just saying she didn't need another evaluation for dementia since she already has a diagnosis of dementia, obtained by neurocognitive testing at [facility]. Patients husband stated that he got 71 pages of paperwork and he doesn't see the diagnosis. I informed patient to call [facility] since they are the ones who dx patient. I then asked patients husband what exactly does he need help with? Patients husband said that he couldn't find the dx of dementia and neurocognitive test. I informed patients husband again to call [facility] and they are the ones he needs to speak to.   I also informed patients husband that patient needs a f/u with Dr. Carles Collet and needs one to maintain care with her. Patients husband said "I am in the process of getting her into assisted living so we might not need it." Advised patients husband to call us if he changes his mind to schedule the f/u.  Patients husband has no further questions or concerns.

## 2021-01-06 NOTE — Telephone Encounter (Signed)
I think that there was a misunderstanding.  The phone call message I got was from husband "asking how his wife might be evaluated for dementia."  I was just saying she didn't need another evaluation for dementia since she already has a diagnosis of dementia, obtained by neurocognitive testing at [facility].  What does he need help with?  Why doesn't she have a follow up appointment with me?  Saw her in novemeber and don't see they made f/u appt with me.  Would need one if they want to maintain care.

## 2021-01-09 ENCOUNTER — Ambulatory Visit (HOSPITAL_COMMUNITY)
Admission: RE | Admit: 2021-01-09 | Discharge: 2021-01-09 | Disposition: A | Discharge status: Home / Self Care | Payer: Medicare HMO | Source: Ambulatory Visit | Attending: Surgery | Admitting: Surgery

## 2021-01-09 ENCOUNTER — Other Ambulatory Visit: Payer: Self-pay

## 2021-01-09 ENCOUNTER — Ambulatory Visit (INDEPENDENT_AMBULATORY_CARE_PROVIDER_SITE_OTHER)
Admission: RE | Admit: 2021-01-09 | Discharge: 2021-01-09 | Disposition: A | Discharge status: Home / Self Care | Payer: Medicare HMO | Source: Ambulatory Visit | Attending: Surgery | Admitting: Surgery

## 2021-01-09 DIAGNOSIS — I70244 Atherosclerosis of native arteries of left leg with ulceration of heel and midfoot: Secondary | ICD-10-CM | POA: Diagnosis not present

## 2021-01-16 ENCOUNTER — Ambulatory Visit (INDEPENDENT_AMBULATORY_CARE_PROVIDER_SITE_OTHER): Payer: Medicare HMO | Admitting: Physician Assistant

## 2021-01-16 ENCOUNTER — Other Ambulatory Visit: Payer: Self-pay

## 2021-01-16 VITALS — BP 158/74 | HR 81 | Temp 98.6°F | Resp 20 | Ht 64.0 in | Wt 120.0 lb

## 2021-01-16 DIAGNOSIS — I70244 Atherosclerosis of native arteries of left leg with ulceration of heel and midfoot: Secondary | ICD-10-CM | POA: Diagnosis not present

## 2021-01-16 NOTE — Progress Notes (Signed)
Office Note     CC:  follow up Requesting Provider:  Josetta Huddle, MD  HPI: Elizabeth Bauer is a 80 y.o. (1940/08/02) female who presents status post left SFA stent via posterior tibial artery access by Dr. Trula Slade on 12/06/2020 due to left third toe ulceration.  She is seen today in a wheelchair and primary historian is her husband.  They are on a waiting list for assisted living facility.  Left third toe ulceration seems to be healing per the patient's husband.  She does not complain of any rest pain and does not have any further tissue loss.  She is on aspirin and statin however the husband is not  sure if she is on Plavix or not.  She is not a tobacco user.  Past Medical History:  Diagnosis Date   Allergy    Cataract    Hyperlipidemia    Parkinson disease ([place])    Thyroid disease     Past Surgical History:  Procedure Laterality Date   ABDOMINAL AORTOGRAM W/LOWER EXTREMITY N/A 11/15/2020   Procedure: ABDOMINAL AORTOGRAM W/LOWER EXTREMITY;  Surgeon: Serafina Mitchell, MD;  Location: [facility];  Service: Cardiovascular;  Laterality: N/A;   ABDOMINAL HYSTERECTOMY     CATARACT EXTRACTION, BILATERAL     PERIPHERAL VASCULAR BALLOON ANGIOPLASTY  11/15/2020   Procedure: PERIPHERAL VASCULAR BALLOON ANGIOPLASTY;  Surgeon: Serafina Mitchell, MD;  Location: [facility];  Service: Cardiovascular;;  Attempted intervention of left distal SFA/popliteal   PERIPHERAL VASCULAR INTERVENTION Left 12/06/2020   Procedure: PERIPHERAL VASCULAR INTERVENTION;  Surgeon: Serafina Mitchell, MD;  Location: [facility];  Service: Cardiovascular;  Laterality: Left;  superficial femoral   TONSILECTOMY, ADENOIDECTOMY, BILATERAL MYRINGOTOMY AND TUBES      Social History   Socioeconomic History   Marital status: Married    Spouse name: Not on file   Number of children: Not on file   Years of education: Not on file   Highest education level: Not on file  Occupational History   Not on file   Tobacco Use   Smoking status: Former   Smokeless tobacco: Never  Vaping Use   Vaping Use: Never used  Substance and Sexual Activity   Alcohol use: Yes    Comment: occasional wine   Drug use: No   Sexual activity: Not on file  Other Topics Concern   Not on file  Social History Narrative   Not on file   Social Determinants of Health   Financial Resource Strain: Not on file  Food Insecurity: Not on file  Transportation Needs: Not on file  Physical Activity: Not on file  Stress: Not on file  Social Connections: Not on file  Intimate Partner Violence: Not on file   History reviewed. No pertinent family history.  Current Outpatient Medications  Medication Sig Dispense Refill   acetaminophen (TYLENOL) 325 MG tablet Take 2 tablets (650 mg total) by mouth every 4 (four) hours as needed for mild pain.     aspirin EC 81 MG tablet Take 81 mg by mouth daily.      atorvastatin (LIPITOR) 10 MG tablet Take 10 mg by mouth 4 (four) times a week.     BIOTIN PO Take 1 tablet by mouth daily.      cholecalciferol (VITAMIN D) 1000 units tablet Take 1,000 Units by mouth daily.     clopidogrel (PLAVIX) 75 MG tablet Take 1 tablet (75 mg total) by mouth daily. 30 tablet 11  CRANBERRY PO Take 1 tablet by mouth daily.     Cyanocobalamin (VITAMIN B-12) 5000 MCG TBDP Take 5,000 mcg by mouth daily.     fluticasone (FLONASE) 50 MCG/ACT nasal spray Place 1-2 sprays into both nostrils daily as needed for allergies or rhinitis.     ibuprofen (ADVIL,MOTRIN) 600 MG tablet Take 1 tablet (600 mg total) by mouth every 6 (six) hours as needed for moderate pain. 30 tablet 0   Ibuprofen-Diphenhydramine Cit (IBUPROFEN PM PO) Take 1 tablet by mouth at bedtime.     levothyroxine (SYNTHROID, LEVOTHROID) 88 MCG tablet Take 88 mcg by mouth daily before breakfast.      mirabegron ER (MYRBETRIQ) 25 MG TB24 tablet Take 25 mg by mouth daily.     Probiotic CAPS Take 1 capsule by mouth daily. From "[employer]"     No current  facility-administered medications for this visit.    Allergies  Allergen Reactions   Cephalexin     Other reaction(s): muscle weakness   Ciprofloxacin     Other reaction(s): cns side effects/diarrhea   Other      Ink with [employer] Other reaction(s): itchy eyes sneezing itchy nose     REVIEW OF SYSTEMS:   '[X]'$  denotes positive finding, '[ ]'$  denotes negative finding Cardiac  Comments:  Chest pain or chest pressure:    Shortness of breath upon exertion:    Short of breath when lying flat:    Irregular heart rhythm:        Vascular    Pain in calf, thigh, or hip brought on by ambulation:    Pain in feet at night that wakes you up from your sleep:     Blood clot in your veins:    Leg swelling:         Pulmonary    Oxygen at home:    Productive cough:     Wheezing:         Neurologic    Sudden weakness in arms or legs:     Sudden numbness in arms or legs:     Sudden onset of difficulty speaking or slurred speech:    Temporary loss of vision in one eye:     Problems with dizziness:         Gastrointestinal    Blood in stool:     Vomited blood:         Genitourinary    Burning when urinating:     Blood in urine:        Psychiatric    Major depression:         Hematologic    Bleeding problems:    Problems with blood clotting too easily:        Skin    Rashes or ulcers:        Constitutional    Fever or chills:      PHYSICAL EXAMINATION:  Vitals:   01/16/21 0934  BP: (!) 158/74  Pulse: 81  Resp: 20  Temp: 98.6 F (37 C)  TempSrc: Temporal  SpO2: 94%  Weight: 120 lb (54.4 kg)  Height: '5\' 4"'$  (1.626 m)    General:  WDWN in NAD; vital signs documented above Gait: Not observed HENT: WNL, normocephalic Pulmonary: normal non-labored breathing  Cardiac: regular HR Abdomen: soft, NT, no masses Skin: without rashes Vascular Exam/Pulses:  Right Left  Radial 2+ (normal) 2+ (normal)  DP 2+ (normal) 2+ (normal)   Extremities: without ischemic changes,  dry scabbed over area of previous  left third toe ulcer; no surrounding erythema or sign of infection; right groin cath site without hematoma Musculoskeletal: no muscle wasting or atrophy  Neurologic: A&O X 3;  No focal weakness or paresthesias are detected Psychiatric:  The pt has Normal affect.   Non-Invasive Vascular Imaging:   Patent left SFA stent on duplex    ASSESSMENT/PLAN:: 80 y.o. female status post left SFA stent via pedal access  Patent left SFA stent by duplex and now with a palpable left DP pulse Left third toe ulceration is dry and appears to be healing Recheck arterial duplex and ABI in 3 months Continue aspirin daily Call/return office sooner with any new symptoms or if ulceration worsens   Dagoberto Ligas, PA-C [facility] [phone]  Clinic MD:   Trula Slade

## 2021-01-18 ENCOUNTER — Other Ambulatory Visit: Payer: Self-pay

## 2021-01-18 DIAGNOSIS — I70244 Atherosclerosis of native arteries of left leg with ulceration of heel and midfoot: Secondary | ICD-10-CM

## 2021-03-21 DIAGNOSIS — Z111 Encounter for screening for respiratory tuberculosis: Secondary | ICD-10-CM | POA: Diagnosis not present

## 2021-03-24 ENCOUNTER — Encounter (HOSPITAL_COMMUNITY): Payer: Self-pay

## 2021-03-24 ENCOUNTER — Inpatient Hospital Stay (HOSPITAL_COMMUNITY)
Admission: EM | Admit: 2021-03-24 | Discharge: 2021-04-25 | DRG: 640 | Disposition: E | Payer: Medicare HMO | Attending: Internal Medicine | Admitting: Internal Medicine

## 2021-03-24 ENCOUNTER — Emergency Department (HOSPITAL_COMMUNITY): Payer: Medicare HMO

## 2021-03-24 ENCOUNTER — Other Ambulatory Visit: Payer: Self-pay

## 2021-03-24 DIAGNOSIS — Z9842 Cataract extraction status, left eye: Secondary | ICD-10-CM | POA: Diagnosis not present

## 2021-03-24 DIAGNOSIS — G20 Parkinson's disease: Secondary | ICD-10-CM | POA: Diagnosis not present

## 2021-03-24 DIAGNOSIS — Z79890 Hormone replacement therapy (postmenopausal): Secondary | ICD-10-CM

## 2021-03-24 DIAGNOSIS — Z681 Body mass index (BMI) 19 or less, adult: Secondary | ICD-10-CM | POA: Diagnosis not present

## 2021-03-24 DIAGNOSIS — L8990 Pressure ulcer of unspecified site, unspecified stage: Secondary | ICD-10-CM | POA: Diagnosis present

## 2021-03-24 DIAGNOSIS — Z9841 Cataract extraction status, right eye: Secondary | ICD-10-CM | POA: Diagnosis not present

## 2021-03-24 DIAGNOSIS — Z90710 Acquired absence of both cervix and uterus: Secondary | ICD-10-CM | POA: Diagnosis not present

## 2021-03-24 DIAGNOSIS — E860 Dehydration: Secondary | ICD-10-CM | POA: Diagnosis present

## 2021-03-24 DIAGNOSIS — E039 Hypothyroidism, unspecified: Secondary | ICD-10-CM | POA: Diagnosis present

## 2021-03-24 DIAGNOSIS — Z515 Encounter for palliative care: Secondary | ICD-10-CM | POA: Diagnosis not present

## 2021-03-24 DIAGNOSIS — Z7902 Long term (current) use of antithrombotics/antiplatelets: Secondary | ICD-10-CM | POA: Diagnosis not present

## 2021-03-24 DIAGNOSIS — R404 Transient alteration of awareness: Secondary | ICD-10-CM | POA: Diagnosis not present

## 2021-03-24 DIAGNOSIS — E876 Hypokalemia: Secondary | ICD-10-CM | POA: Diagnosis present

## 2021-03-24 DIAGNOSIS — R531 Weakness: Secondary | ICD-10-CM | POA: Diagnosis not present

## 2021-03-24 DIAGNOSIS — G20A1 Parkinson's disease without dyskinesia, without mention of fluctuations: Secondary | ICD-10-CM | POA: Diagnosis present

## 2021-03-24 DIAGNOSIS — F0280 Dementia in other diseases classified elsewhere without behavioral disturbance: Secondary | ICD-10-CM | POA: Diagnosis present

## 2021-03-24 DIAGNOSIS — R4020 Unspecified coma: Secondary | ICD-10-CM | POA: Diagnosis not present

## 2021-03-24 DIAGNOSIS — I499 Cardiac arrhythmia, unspecified: Secondary | ICD-10-CM | POA: Diagnosis not present

## 2021-03-24 DIAGNOSIS — G9341 Metabolic encephalopathy: Secondary | ICD-10-CM | POA: Diagnosis present

## 2021-03-24 DIAGNOSIS — Z9109 Other allergy status, other than to drugs and biological substances: Secondary | ICD-10-CM

## 2021-03-24 DIAGNOSIS — E870 Hyperosmolality and hypernatremia: Secondary | ICD-10-CM | POA: Diagnosis not present

## 2021-03-24 DIAGNOSIS — R000 Tachycardia, unspecified: Secondary | ICD-10-CM | POA: Diagnosis present

## 2021-03-24 DIAGNOSIS — L89152 Pressure ulcer of sacral region, stage 2: Secondary | ICD-10-CM | POA: Diagnosis present

## 2021-03-24 DIAGNOSIS — S2231XA Fracture of one rib, right side, initial encounter for closed fracture: Secondary | ICD-10-CM | POA: Diagnosis not present

## 2021-03-24 DIAGNOSIS — Z20822 Contact with and (suspected) exposure to covid-19: Secondary | ICD-10-CM | POA: Diagnosis present

## 2021-03-24 DIAGNOSIS — R64 Cachexia: Secondary | ICD-10-CM | POA: Diagnosis present

## 2021-03-24 DIAGNOSIS — Z743 Need for continuous supervision: Secondary | ICD-10-CM | POA: Diagnosis not present

## 2021-03-24 DIAGNOSIS — R0602 Shortness of breath: Secondary | ICD-10-CM | POA: Diagnosis not present

## 2021-03-24 DIAGNOSIS — R69 Illness, unspecified: Secondary | ICD-10-CM | POA: Diagnosis not present

## 2021-03-24 DIAGNOSIS — D72829 Elevated white blood cell count, unspecified: Secondary | ICD-10-CM | POA: Diagnosis present

## 2021-03-24 DIAGNOSIS — Z881 Allergy status to other antibiotic agents status: Secondary | ICD-10-CM

## 2021-03-24 DIAGNOSIS — Z66 Do not resuscitate: Secondary | ICD-10-CM | POA: Diagnosis present

## 2021-03-24 DIAGNOSIS — I4891 Unspecified atrial fibrillation: Secondary | ICD-10-CM | POA: Diagnosis present

## 2021-03-24 DIAGNOSIS — Z7189 Other specified counseling: Secondary | ICD-10-CM | POA: Diagnosis not present

## 2021-03-24 DIAGNOSIS — R030 Elevated blood-pressure reading, without diagnosis of hypertension: Secondary | ICD-10-CM | POA: Diagnosis present

## 2021-03-24 HISTORY — DX: Hypothyroidism, unspecified: E03.9

## 2021-03-24 HISTORY — DX: Unspecified dementia, unspecified severity, without behavioral disturbance, psychotic disturbance, mood disturbance, and anxiety: F03.90

## 2021-03-24 LAB — URINALYSIS, ROUTINE W REFLEX MICROSCOPIC
Bilirubin Urine: NEGATIVE
Glucose, UA: NEGATIVE mg/dL
Ketones, ur: 5 mg/dL — AB
Leukocytes,Ua: NEGATIVE
Nitrite: NEGATIVE
Protein, ur: 30 mg/dL — AB
Specific Gravity, Urine: 1.018 (ref 1.005–1.030)
pH: 5 (ref 5.0–8.0)

## 2021-03-24 LAB — CBC WITH DIFFERENTIAL/PLATELET
Abs Immature Granulocytes: 0.13 10*3/uL — ABNORMAL HIGH (ref 0.00–0.07)
Basophils Absolute: 0 10*3/uL (ref 0.0–0.1)
Basophils Relative: 0 %
Eosinophils Absolute: 0 10*3/uL (ref 0.0–0.5)
Eosinophils Relative: 0 %
HCT: 47.1 % — ABNORMAL HIGH (ref 36.0–46.0)
Hemoglobin: 14.1 g/dL (ref 12.0–15.0)
Immature Granulocytes: 1 %
Lymphocytes Relative: 4 %
Lymphs Abs: 0.7 10*3/uL (ref 0.7–4.0)
MCH: 28.5 pg (ref 26.0–34.0)
MCHC: 29.9 g/dL — ABNORMAL LOW (ref 30.0–36.0)
MCV: 95.3 fL (ref 80.0–100.0)
Monocytes Absolute: 1.2 10*3/uL — ABNORMAL HIGH (ref 0.1–1.0)
Monocytes Relative: 7 %
Neutro Abs: 15.8 10*3/uL — ABNORMAL HIGH (ref 1.7–7.7)
Neutrophils Relative %: 88 %
Platelets: 294 10*3/uL (ref 150–400)
RBC: 4.94 MIL/uL (ref 3.87–5.11)
RDW: 14.9 % (ref 11.5–15.5)
WBC: 17.8 10*3/uL — ABNORMAL HIGH (ref 4.0–10.5)
nRBC: 0 % (ref 0.0–0.2)

## 2021-03-24 LAB — COMPREHENSIVE METABOLIC PANEL
ALT: 20 U/L (ref 0–44)
AST: 30 U/L (ref 15–41)
Albumin: 3.1 g/dL — ABNORMAL LOW (ref 3.5–5.0)
Alkaline Phosphatase: 67 U/L (ref 38–126)
Anion gap: 10 (ref 5–15)
BUN: 57 mg/dL — ABNORMAL HIGH (ref 8–23)
CO2: 29 mmol/L (ref 22–32)
Calcium: 10 mg/dL (ref 8.9–10.3)
Chloride: 124 mmol/L — ABNORMAL HIGH (ref 98–111)
Creatinine, Ser: 1.07 mg/dL — ABNORMAL HIGH (ref 0.44–1.00)
GFR, Estimated: 53 mL/min — ABNORMAL LOW (ref >60)
Glucose, Bld: 120 mg/dL — ABNORMAL HIGH (ref 70–99)
Potassium: 3.2 mmol/L — ABNORMAL LOW (ref 3.5–5.1)
Sodium: 163 mmol/L (ref 135–145)
Total Bilirubin: 1 mg/dL (ref 0.3–1.2)
Total Protein: 6.4 g/dL — ABNORMAL LOW (ref 6.5–8.1)

## 2021-03-24 LAB — TROPONIN I (HIGH SENSITIVITY): Troponin I (High Sensitivity): 8 ng/L (ref <18)

## 2021-03-24 LAB — LACTIC ACID, PLASMA: Lactic Acid, Venous: 1.7 mmol/L (ref 0.5–1.9)

## 2021-03-24 LAB — RESP PANEL BY RT-PCR (FLU A&B, COVID) ARPGX2
Influenza A by PCR: NEGATIVE
Influenza B by PCR: NEGATIVE
SARS Coronavirus 2 by RT PCR: NEGATIVE

## 2021-03-24 MED ORDER — ONDANSETRON HCL 4 MG/2ML IJ SOLN: 4.0000 mg | Freq: Four times a day (QID) | INTRAMUSCULAR | Status: DC | PRN

## 2021-03-24 MED ORDER — ACETAMINOPHEN 650 MG RE SUPP: 650.0000 mg | Freq: Four times a day (QID) | RECTAL | Status: DC | PRN

## 2021-03-24 MED ORDER — SODIUM CHLORIDE 0.9 % IV SOLN: Freq: Once | INTRAVENOUS | Status: DC

## 2021-03-24 MED ORDER — HYDRALAZINE HCL 20 MG/ML IJ SOLN: 10.0000 mg | Freq: Four times a day (QID) | INTRAMUSCULAR | Status: DC | PRN

## 2021-03-24 MED ORDER — ACETAMINOPHEN 325 MG PO TABS: 650.0000 mg | ORAL_TABLET | Freq: Four times a day (QID) | ORAL | Status: DC | PRN

## 2021-03-24 MED ORDER — ENOXAPARIN SODIUM 40 MG/0.4ML IJ SOSY
40.0000 mg | PREFILLED_SYRINGE | INTRAMUSCULAR | Status: DC
  Administered 2021-03-24: 40 mg via SUBCUTANEOUS
  Filled 2021-03-24: qty 0.4

## 2021-03-24 MED ORDER — INFLUENZA VAC A&B SA ADJ QUAD 0.5 ML IM PRSY
0.5000 mL | PREFILLED_SYRINGE | INTRAMUSCULAR | Status: DC
  Filled 2021-03-24: qty 0.5

## 2021-03-24 MED ORDER — DEXTROSE 5 % IV SOLN: Freq: Once | INTRAVENOUS | Status: AC

## 2021-03-24 MED ORDER — ONDANSETRON HCL 4 MG PO TABS: 4.0000 mg | ORAL_TABLET | Freq: Four times a day (QID) | ORAL | Status: DC | PRN

## 2021-03-24 MED ORDER — POTASSIUM CHLORIDE 10 MEQ/100ML IV SOLN
10.0000 meq | INTRAVENOUS | Status: AC
Start: 2021-03-24 — End: 2021-03-24
  Administered 2021-03-24 (×4): 10 meq via INTRAVENOUS
  Filled 2021-03-24 (×5): qty 100

## 2021-03-24 NOTE — H&P (Signed)
[facility] History and Physical  Elizabeth Bauer LKG:[id number] DOB: 1940/09/04 DOA: 02/25/2021   PCP: Josetta Huddle, MD  Specialists: Previously followed by Dr. Carles Collet with neurology  Chief Complaint: Altered mental status, not eating or drinking  HPI: Elizabeth Bauer is a 80 y.o. 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  Patient has had a worsening in her condition over the past few weeks where she has stopped eating and drinking.  Patient husband is the primary historian.  Patient is unable to provide any information due to her altered mental status.  Prior to a few weeks ago patient was requiring assistance with feeding which was being done by her husband.  But in the last 2 weeks her oral intake has significantly decreased.  He also felt that she was experiencing some difficulty breathing so he decided to bring her to the hospital.  He also admits that patient has lost some weight but he is unable to quantify.  It appears that patient weighed about 150 pounds in May 2021 and her current weight is recorded as 115 pounds.  In the ED initially her heart rate was noted to be in the 170s.  It was felt that it was atrial fibrillation.  Patient's heart rate subsequently came down.  Labs were done which showed sodium to be 163.  Chest x-ray did not show any acute findings.  She will be hospitalized for further management.   Home Medications: Prior to Admission medications   Medication Sig Start Date End Date Taking? Authorizing Provider  atorvastatin (LIPITOR) 10 MG tablet Take 10 mg by mouth 4 (four) times a week. Monday Tuesday Wednesday Thursday 08/31/20  Yes [provider]  clopidogrel (PLAVIX) 75 MG tablet Take 1 tablet (75 mg total) by mouth daily. 12/06/20  Yes Serafina Mitchell, MD  levothyroxine (SYNTHROID, LEVOTHROID) 88 MCG tablet Take 88 mcg by mouth daily before  breakfast.  11/25/15  Yes [provider]  mirabegron ER (MYRBETRIQ) 25 MG TB24 tablet Take 25 mg by mouth daily.   Yes [provider]  ibuprofen (ADVIL,MOTRIN) 600 MG tablet Take 1 tablet (600 mg total) by mouth every 6 (six) hours as needed for moderate pain. 08/24/16   Jill Alexanders, PA-C    Allergies:  Allergies  Allergen Reactions   Cephalexin     Other reaction(s): muscle weakness   Ciprofloxacin     Other reaction(s): cns side effects/diarrhea   Other      Ink with Maganese Other reaction(s): itchy eyes sneezing itchy nose    Past Medical History: Past Medical History:  Diagnosis Date   Allergy    Cataract    Dementia ([place])    Hyperlipidemia    Hypothyroidism    Parkinson disease ([place])    Parkinson disease ([place])    Thyroid disease     Past Surgical History:  Procedure Laterality Date   ABDOMINAL AORTOGRAM W/LOWER EXTREMITY N/A 11/15/2020   Procedure: ABDOMINAL AORTOGRAM W/LOWER EXTREMITY;  Surgeon: Serafina Mitchell, MD;  Location: [facility];  Service: Cardiovascular;  Laterality: N/A;   ABDOMINAL HYSTERECTOMY     CATARACT EXTRACTION, BILATERAL     PERIPHERAL VASCULAR BALLOON ANGIOPLASTY  11/15/2020   Procedure: PERIPHERAL VASCULAR BALLOON ANGIOPLASTY;  Surgeon: Serafina Mitchell, MD;  Location: [facility];  Service: Cardiovascular;;  Attempted intervention of left distal SFA/popliteal   PERIPHERAL VASCULAR INTERVENTION Left 12/06/2020   Procedure:  PERIPHERAL VASCULAR INTERVENTION;  Surgeon: Serafina Mitchell, MD;  Location: [facility];  Service: Cardiovascular;  Laterality: Left;  superficial femoral   TONSILECTOMY, ADENOIDECTOMY, BILATERAL MYRINGOTOMY AND TUBES      Social History: Lives with her husband.  No other information available at this time due to altered mental status.  Family History:  Unable to obtain due to altered mental status  Review of Systems -unable to do due to altered mental status  Physical  Examination  Vitals:   03/14/2021 1510 03/23/2021 1600 03/18/2021 1630 02/26/2021 1700  BP: (!) 178/86 (!) 172/85 (!) 182/86 (!) 186/85  Pulse: 96 93  94  Resp: (!) 24 (!) 22 (!) 23 (!) 24  Temp:      TempSrc:      SpO2: 100% 100%  100%  Weight: 52.2 kg     Height: 5\' 4"  (1.626 m)       BP (!) 186/85   Pulse 94   Temp 97.7 F (36.5 C) (Oral)   Resp (!) 24   Ht 5\' 4"  (1.626 m)   Wt 52.2 kg   SpO2 100%   BMI 19.74 kg/m   General appearance: alert, cachectic, and no distress Head: Normocephalic, without obvious abnormality, atraumatic Eyes: conjunctivae/corneas clear. PERRL Throat: Dry mucous membranes Neck: no adenopathy, no carotid bruit, no JVD, supple, symmetrical, trachea midline, and thyroid not enlarged, symmetric, no tenderness/mass/nodules Resp: Normal effort at rest.  Coarse breath sounds.  No crackles wheezing or rhonchi appreciated. Cardio: regular rate and rhythm, S1, S2 normal, no murmur, click, rub or gallop GI: soft, non-tender; bowel sounds normal; no masses,  no organomegaly Extremities: extremities normal, atraumatic, no cyanosis or edema Pulses: 2+ and symmetric Skin: Skin color, texture, turgor normal. No rashes or lesions Lymph nodes: Cervical, supraclavicular, and axillary nodes normal. Neurologic: She is awake with eyes open.  Does not answer any questions.  No obvious focal neurological deficits.  Does have cogwheel rigidity of the upper extremities   Labs on Admission: I have personally reviewed following labs and imaging studies  CBC: Recent Labs  Lab 03/15/2021 1425  WBC 17.8*  NEUTROABS 15.8*  HGB 14.1  HCT 47.1*  MCV 95.3  PLT 211   Basic Metabolic Panel: Recent Labs  Lab 03/03/2021 1425  NA 163*  K 3.2*  CL 124*  CO2 29  GLUCOSE 120*  BUN 57*  CREATININE 1.07*  CALCIUM 10.0   GFR: Estimated Creatinine Clearance: 35.1 mL/min (A) (by C-G formula based on SCr of 1.07 mg/dL (H)). Liver Function Tests: Recent Labs  Lab 03/05/2021 1425   AST 30  ALT 20  ALKPHOS 67  BILITOT 1.0  PROT 6.4*  ALBUMIN 3.1*      Radiological Exams on Admission: DG Chest Port 1 View  Result Date: 03/08/2021 CLINICAL DATA:  Short of breath and weakness EXAM: PORTABLE CHEST 1 VIEW COMPARISON:  08/23/2016 FINDINGS: Heart size and vascularity normal. Negative for heart failure. Lungs clear without infiltrate or effusion. Chronic right rib fractures. IMPRESSION: No active disease. Electronically Signed   By: Franchot Gallo M.D.   On: 03/14/2021 13:22    My interpretation of Electrocardiogram: EKG is interpreted by the computer as atrial fibrillation.  However it is a poor quality EKG.  Appears to be sinus rhythm in the early 100s.  Left axis deviation.  Intervals appear to be normal.  Nonspecific ST segment and T wave changes.   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  Patient has had significantly poor oral intake over the last 2 to 3 weeks.  There was some concern for atrial fibrillation as well as the patient was initially tachycardic.  Her presentation is most likely secondary to advanced Parkinson's with dementia.  She is likely progressing to end-of-life.  Plan:  #1. Hypernatremia with hypokalemia: All of this is due to very poor free water intake.  She is noted to be hypertensive.  We will give her D5 water infusion.  Replace her potassium.  Check magnesium level.  #2. Leukocytosis: Most likely due to hemoconcentration.  She is afebrile.  No obvious source of infection is found.  Hold off on antibiotics for now.  #3.  Tachycardia/elevated blood pressure: The EKG  appears to be sinus rhythm rather than atrial fibrillation.  Unclear what her rhythm was when she was tachycardic.  In any case her overall prognosis is poor.  Will not pursue this further at this time.  Husband is agreeable.  Home medication list does not show any antihypertensives.  Hydralazine as needed will be ordered.  #4.  Advanced Parkinson's disease with dementia/goals of care: This has been progressively worsening over the past few years.  She was originally diagnosed in 2011.  Initially followed by neurology at [facility].  Over the last few years has been followed by Dr. Carles Collet in [place].  She was taken off of her medications about a year or 2 ago since she was declining.  Discussed in detail with patient's husband.  I feel that the patient is reaching end-of-life since she is not able to take anything by mouth.  Patient's husband would like to try IV fluids to see if there is any improvement.  This is reasonable to try.  He does not want any heroic measures.  If patient does not improve in the next 48 hours we will proceed with residential hospice.   #5. Acute metabolic encephalopathy: Likely multifactorial including Parkinson's disease, dementia, hyponatremia, dehydration.  No focal neurological deficits noted.  No indication for imaging studies at this time.   DVT Prophylaxis: Lovenox Code Status: DNR Family Communication: Discussed with the patient's husband Disposition: Unclear for now Consults called: None yet Admission Status: Status is: Inpatient  Remains inpatient appropriate because:Altered mental status, IV treatments appropriate due to intensity of illness or inability to take PO, and Inpatient level of care appropriate due to severity of illness  Dispo: The patient is from: Home              Anticipated d/c is to:  To be determined              Patient currently is not medically stable to d/c.   Difficult to place patient No   Severity of Illness: The  appropriate patient status for this patient is INPATIENT. Inpatient status is judged to be reasonable and necessary in order to provide the required intensity of service to ensure the patient's safety. The patient's presenting symptoms, physical exam findings, and initial radiographic and laboratory data in the context of their chronic comorbidities is felt to place them at high risk for further clinical deterioration. Furthermore, it is not anticipated  that the patient will be medically stable for discharge from the hospital within 2 midnights of admission. The following factors support the patient status of inpatient.   " The patient's presenting symptoms include altered mental status, poor oral intake. " The worrisome physical exam findings include elevated blood pressure encephalopathy. " The initial radiographic and laboratory data are worrisome because of hyponatremia, hypokalemia. " The chronic co-morbidities include Parkinson's disease.   * I certify that at the point of admission it is my clinical judgment that the patient will require inpatient hospital care spanning beyond 2 midnights from the point of admission due to high intensity of service, high risk for further deterioration and high frequency of surveillance required.*   Further management decisions will depend on results of further testing and patient's response to treatment.   Elizabeth Bauer [employer]  [facility] operational officer on [email or web address]  03/21/2021, 5:09 PM

## 2021-03-24 NOTE — Progress Notes (Signed)
Patient's daughter Elizabeth Bauer coming from [place], and will arrive at approximately 2100. Charge Nurse has approved for daughter to come see her mother tonight, as she is likely moving towards comfort care, and her prognosis is poor (per Attending MD).    Called [phone] ED Admitting to request that this family member be allowed into her mother's room tonight.    [place], Therapist, sports

## 2021-03-24 NOTE — Plan of Care (Signed)
Discussed with husband about plan of care at this time.   Will continue to monitor patient.    [place], Therapist, sports

## 2021-03-24 NOTE — ED Triage Notes (Signed)
"  Called out for lethargy, decreased responsiveness, advanced parkinsons over last year, worse over last 2 weeks, spouse has been caring for her at home, this morning he wasn't able to arouse her as much, she can't eat or drink much, family has been in process of working on nursing home placement" per EMS

## 2021-03-24 NOTE — ED Triage Notes (Signed)
"  She does not want CPR or life support or any aggressive measures done" per spouse  Encouraged him to speak with provider about signing a DNR form for her record today.

## 2021-03-24 NOTE — ED Provider Notes (Signed)
[facility] EMERGENCY DEPARTMENT Provider Note  CSN: [id number] Arrival date & time: 02/27/2021 1154    History Chief Complaint  Patient presents with   Fatigue    Elizabeth Bauer is a 80 y.o. female with history of advanced parkinson disease brought by EMS from home. Husband is at bedside and provides the history. She has had gradual decline over several years since her diagnosis but in the last 2 weeks has had a sudden worsening. She was previously alert and able to help with ADLs including feeding herself but in recent days has been unable to do anything. Husband has been feeding her, denies any signs of choking with swallowing. She has been unresponsive to him during that day. He noted she was having some difficulty breathing this morning and so he called the ambulance. She would not want any heroic measures, CPD, intubation of life support but she would want fluids and antibiotics if that would be helpful.    Past Medical History:  Diagnosis Date   Allergy    Cataract    Hyperlipidemia    Parkinson disease ([place])    Thyroid disease     Past Surgical History:  Procedure Laterality Date   ABDOMINAL AORTOGRAM W/LOWER EXTREMITY N/A 11/15/2020   Procedure: ABDOMINAL AORTOGRAM W/LOWER EXTREMITY;  Surgeon: Serafina Mitchell, MD;  Location: [facility];  Service: Cardiovascular;  Laterality: N/A;   ABDOMINAL HYSTERECTOMY     CATARACT EXTRACTION, BILATERAL     PERIPHERAL VASCULAR BALLOON ANGIOPLASTY  11/15/2020   Procedure: PERIPHERAL VASCULAR BALLOON ANGIOPLASTY;  Surgeon: Serafina Mitchell, MD;  Location: [facility];  Service: Cardiovascular;;  Attempted intervention of left distal SFA/popliteal   PERIPHERAL VASCULAR INTERVENTION Left 12/06/2020   Procedure: PERIPHERAL VASCULAR INTERVENTION;  Surgeon: Serafina Mitchell, MD;  Location: [facility];  Service: Cardiovascular;  Laterality: Left;  superficial femoral   TONSILECTOMY, ADENOIDECTOMY, BILATERAL MYRINGOTOMY AND  TUBES      No family history on file.  Social History   Tobacco Use   Smoking status: Former   Smokeless tobacco: Never  Scientific laboratory technician Use: Never used  Substance Use Topics   Alcohol use: Yes    Comment: occasional wine   Drug use: No     Home Medications Prior to Admission medications   Medication Sig Start Date End Date Taking? Authorizing Provider  atorvastatin (LIPITOR) 10 MG tablet Take 10 mg by mouth 4 (four) times a week. Monday Tuesday Wednesday Thursday 08/31/20  Yes [provider]  clopidogrel (PLAVIX) 75 MG tablet Take 1 tablet (75 mg total) by mouth daily. 12/06/20  Yes Serafina Mitchell, MD  levothyroxine (SYNTHROID, LEVOTHROID) 88 MCG tablet Take 88 mcg by mouth daily before breakfast.  11/25/15  Yes [provider]  mirabegron ER (MYRBETRIQ) 25 MG TB24 tablet Take 25 mg by mouth daily.   Yes [provider]  ibuprofen (ADVIL,MOTRIN) 600 MG tablet Take 1 tablet (600 mg total) by mouth every 6 (six) hours as needed for moderate pain. 08/24/16   Jill Alexanders, PA-C     Allergies    Cephalexin, Ciprofloxacin, and Other   Review of Systems   Review of Systems Unable to assess due to mental status.    Physical Exam BP (!) 182/86   Pulse 93   Temp 97.7 F (36.5 C) (Oral)   Resp (!) 23   Ht 5\' 4"  (1.626 m)   Wt 52.2 kg   SpO2 100%  BMI 19.74 kg/m   Physical Exam Vitals and nursing note reviewed.  Constitutional:      Appearance: Normal appearance.  HENT:     Head: Normocephalic and atraumatic.     Nose: Nose normal.     Mouth/Throat:     Mouth: Mucous membranes are dry.  Eyes:     Extraocular Movements: Extraocular movements intact.     Conjunctiva/sclera: Conjunctivae normal.  Cardiovascular:     Rate and Rhythm: Tachycardia present. Rhythm irregular.  Pulmonary:     Breath sounds: Normal breath sounds.     Comments: Increased respiratory rate Abdominal:     General: Abdomen is flat.     Palpations: Abdomen  is soft.     Tenderness: There is no abdominal tenderness.  Musculoskeletal:        General: No swelling. Normal range of motion.     Cervical back: Neck supple.     Comments: Hands/feet are cool to touch, palpable pulses  Skin:    General: Skin is dry.  Neurological:     Comments: Unable to fully assess, awake but does not respond to verbal stimuli or attempt to speak.  Psychiatric:     Comments: calm     ED Results / Procedures / Treatments   Labs (all labs ordered are listed, but only abnormal results are displayed) Labs Reviewed  COMPREHENSIVE METABOLIC PANEL - Abnormal; Notable for the following components:      Result Value   Sodium 163 (*)    Potassium 3.2 (*)    Chloride 124 (*)    Glucose, Bld 120 (*)    BUN 57 (*)    Creatinine, Ser 1.07 (*)    Total Protein 6.4 (*)    Albumin 3.1 (*)    GFR, Estimated 53 (*)    All other components within normal limits  CBC WITH DIFFERENTIAL/PLATELET - Abnormal; Notable for the following components:   WBC 17.8 (*)    HCT 47.1 (*)    MCHC 29.9 (*)    Neutro Abs 15.8 (*)    Monocytes Absolute 1.2 (*)    Abs Immature Granulocytes 0.13 (*)    All other components within normal limits  URINALYSIS, ROUTINE W REFLEX MICROSCOPIC - Abnormal; Notable for the following components:   Hgb urine dipstick SMALL (*)    Ketones, ur 5 (*)    Protein, ur 30 (*)    Bacteria, UA MANY (*)    All other components within normal limits  RESP PANEL BY RT-PCR (FLU A&B, COVID) ARPGX2  LACTIC ACID, PLASMA  LACTIC ACID, PLASMA  SODIUM, URINE, RANDOM  TROPONIN I (HIGH SENSITIVITY)  TROPONIN I (HIGH SENSITIVITY)    EKG EKG Interpretation  Date/Time:  Friday March 24 2021 14:07:14 EDT Ventricular Rate:  102 PR Interval:    QRS Duration: 75 QT Interval:  341 QTC Calculation: 445 R Axis:   -62 Text Interpretation: Atrial fibrillation Muscle tremor Left anterior fascicular block Nonspecific repol abnormality, diffuse leads Since last tracing  afib is now present Confirmed by Calvert Cantor [phone]) on 03/03/2021 2:09:33 PM  Radiology DG Chest Port 1 View  Result Date: 03/01/2021 CLINICAL DATA:  Short of breath and weakness EXAM: PORTABLE CHEST 1 VIEW COMPARISON:  08/23/2016 FINDINGS: Heart size and vascularity normal. Negative for heart failure. Lungs clear without infiltrate or effusion. Chronic right rib fractures. IMPRESSION: No active disease. Electronically Signed   By: Franchot Gallo M.D.   On: 03/20/2021 13:22    Procedures .Critical Care Performed  by: Truddie Hidden, MD Authorized by: Truddie Hidden, MD   Critical care provider statement:    Critical care time (minutes):  45   Critical care time was exclusive of:  Separately billable procedures and treating other patients   Critical care was necessary to treat or prevent imminent or life-threatening deterioration of the following conditions:  Metabolic crisis   Critical care was time spent personally by me on the following activities:  Discussions with consultants, evaluation of patient's response to treatment, examination of patient, ordering and performing treatments and interventions, ordering and review of laboratory studies, ordering and review of radiographic studies, pulse oximetry, re-evaluation of patient's condition, obtaining history from patient or surrogate and review of old charts   Care discussed with: admitting provider    Medications Ordered in the ED Medications  dextrose 5 % solution (has no administration in time range)     MDM Rules/Calculators/A&P MDM Patient with advanced parkinson's, here with acute change in recent days, noted to be tachycardic, appears to afib on monitor and tachypneic. Will check labs, EKG and CXR.   ED Course  I have reviewed the triage vital signs and the nursing notes.  Pertinent labs & imaging results that were available during my care of the patient were reviewed by me and considered in my medical decision  making (see chart for details).  Clinical Course as of 03/08/2021 1644  Fri Mar 24, 2021  1412 HR is improved on EKG.  [CS]  2951 UA without infection.  [CS]  1517 Lactic acid is neg.  [CS]  8841 CBC with leukocytosis, otherwise unremarkable.  [CS]  6606 CMP with markedly elevated Na, Trop is neg. Will discuss admission with hospitalist.  [CS]  1643 Spoke with Dr. Curly Rim, Hospitalist who will admit. Recommends D5 water for hypernatremia.  [CS]    Clinical Course User Index [CS] Truddie Hidden, MD    Final Clinical Impression(s) / ED Diagnoses Final diagnoses:  Hypernatremia  New onset a-fib [facility])  Parkinson's disease [facility])    Rx / DC Orders ED Discharge Orders     None        Truddie Hidden, MD 03/09/2021 1644

## 2021-03-25 DIAGNOSIS — Z7189 Other specified counseling: Secondary | ICD-10-CM | POA: Diagnosis not present

## 2021-03-25 DIAGNOSIS — E870 Hyperosmolality and hypernatremia: Secondary | ICD-10-CM | POA: Diagnosis not present

## 2021-03-25 DIAGNOSIS — G20 Parkinson's disease: Secondary | ICD-10-CM | POA: Diagnosis not present

## 2021-03-25 DIAGNOSIS — F0280 Dementia in other diseases classified elsewhere without behavioral disturbance: Secondary | ICD-10-CM | POA: Diagnosis not present

## 2021-03-25 DIAGNOSIS — L8990 Pressure ulcer of unspecified site, unspecified stage: Secondary | ICD-10-CM | POA: Diagnosis present

## 2021-03-25 LAB — CBC
HCT: 42.6 % (ref 36.0–46.0)
Hemoglobin: 12.9 g/dL (ref 12.0–15.0)
MCH: 28.5 pg (ref 26.0–34.0)
MCHC: 30.3 g/dL (ref 30.0–36.0)
MCV: 94 fL (ref 80.0–100.0)
Platelets: 309 10*3/uL (ref 150–400)
RBC: 4.53 MIL/uL (ref 3.87–5.11)
RDW: 15 % (ref 11.5–15.5)
WBC: 18.6 10*3/uL — ABNORMAL HIGH (ref 4.0–10.5)
nRBC: 0 % (ref 0.0–0.2)

## 2021-03-25 LAB — MAGNESIUM: Magnesium: 2.3 mg/dL (ref 1.7–2.4)

## 2021-03-25 LAB — BASIC METABOLIC PANEL
Anion gap: 6 (ref 5–15)
BUN: 56 mg/dL — ABNORMAL HIGH (ref 8–23)
CO2: 29 mmol/L (ref 22–32)
Calcium: 10.1 mg/dL (ref 8.9–10.3)
Chloride: 127 mmol/L — ABNORMAL HIGH (ref 98–111)
Creatinine, Ser: 1 mg/dL (ref 0.44–1.00)
GFR, Estimated: 57 mL/min — ABNORMAL LOW (ref >60)
Glucose, Bld: 146 mg/dL — ABNORMAL HIGH (ref 70–99)
Potassium: 3.5 mmol/L (ref 3.5–5.1)
Sodium: 162 mmol/L (ref 135–145)

## 2021-03-25 MED ORDER — MORPHINE SULFATE (CONCENTRATE) 10 MG/0.5ML PO SOLN: 5.0000 mg | ORAL | Status: DC | PRN

## 2021-03-25 MED ORDER — MORPHINE SULFATE (CONCENTRATE) 10 MG/0.5ML PO SOLN
5.0000 mg | ORAL | Status: DC | PRN
Start: 2021-03-25 — End: 2021-03-29

## 2021-03-25 MED ORDER — HALOPERIDOL 0.5 MG PO TABS
0.5000 mg | ORAL_TABLET | ORAL | Status: DC | PRN
  Filled 2021-03-25: qty 1

## 2021-03-25 MED ORDER — MORPHINE SULFATE (PF) 2 MG/ML IV SOLN
2.0000 mg | Freq: Once | INTRAVENOUS | Status: AC
  Administered 2021-03-25: 2 mg via INTRAVENOUS
  Filled 2021-03-25: qty 1

## 2021-03-25 MED ORDER — HALOPERIDOL LACTATE 2 MG/ML PO CONC
0.5000 mg | ORAL | Status: DC | PRN
  Filled 2021-03-25: qty 0.3

## 2021-03-25 MED ORDER — BIOTENE DRY MOUTH MT LIQD: 15.0000 mL | OROMUCOSAL | Status: DC | PRN

## 2021-03-25 MED ORDER — POLYVINYL ALCOHOL 1.4 % OP SOLN
1.0000 [drp] | Freq: Four times a day (QID) | OPHTHALMIC | Status: DC | PRN
  Filled 2021-03-25: qty 15

## 2021-03-25 MED ORDER — DEXTROSE 5 % IV SOLN: INTRAVENOUS | Status: DC

## 2021-03-25 MED ORDER — GLYCOPYRROLATE 1 MG PO TABS
1.0000 mg | ORAL_TABLET | ORAL | Status: DC | PRN
  Filled 2021-03-25: qty 1

## 2021-03-25 MED ORDER — GLYCOPYRROLATE 0.2 MG/ML IJ SOLN
0.2000 mg | INTRAMUSCULAR | Status: DC | PRN
  Filled 2021-03-25: qty 1

## 2021-03-25 MED ORDER — HALOPERIDOL LACTATE 5 MG/ML IJ SOLN
0.5000 mg | INTRAMUSCULAR | Status: DC | PRN
  Administered 2021-03-25 – 2021-03-27 (×4): 0.5 mg via INTRAVENOUS
  Filled 2021-03-25 (×4): qty 1

## 2021-03-25 MED ORDER — GLYCOPYRROLATE 0.2 MG/ML IJ SOLN
0.2000 mg | INTRAMUSCULAR | Status: DC | PRN
  Administered 2021-03-25 – 2021-03-26 (×3): 0.2 mg via INTRAVENOUS
  Filled 2021-03-25 (×2): qty 1

## 2021-03-25 NOTE — Consult Note (Signed)
Referral received for [facility] placement.  No beds available at this time.  Will continue to follow.

## 2021-03-25 NOTE — Progress Notes (Signed)
Nutrition Brief Note  Patient identified on the Malnutrition Screening Tool Report. However, Chart reviewed and pt now transitioning to comfort care. No nutrition interventions planned at this time.  Please re-consult as needed.   Ranell Patrick, RD, LDN Clinical Dietitian Pager on [place]

## 2021-03-25 NOTE — Progress Notes (Addendum)
[facility] PROGRESS NOTE   Elizabeth Bauer TML:[id number] DOB: [birth year] DOA: 03/05/2021  PCP: Josetta Huddle, MD  Brief History/Interval Summary: 80 y.o. 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  Patient has had a worsening in her condition over the past few weeks where she has stopped eating and drinking.  Patient was unable to provide any history due to altered mental status.  Patient appears to have lost a lot of weight in the last few months as well.  Patient was found to have hypernatremia.  She was hospitalized for further management.    Consultants: None  Procedures: None    Subjective/Interval History: Patient noted to be less responsive today compared to yesterday.  Her husband and daughter at the bedside.    Assessment/Plan:  Hypernatremia/hypokalemia Elevated sodium level due to poor free water intake due to her advancing Parkinson's disease.  She was placed on D5 infusion with no significant change to her sodium level.  Prognosis is very poor.  Discussed with the patient's husband and daughter.  They would like to proceed with a residential hospice placement closer to [facility].  We will consult social worker to assist.  Leukocytosis Likely due to hemoconcentration.  She is afebrile.  No obvious source of infection.  No further work-up at this time.  Elevated blood pressure No documented history of hypertension.  Blood pressures were elevated yesterday.  Hydralazine as needed for was ordered.  Seems to be stable.  Tachycardia appears to have improved.  There was some concern for atrial fibrillation however no concrete documentation of same.  No further work-up as patient has poor prognosis and heading towards end-of-life.  Advanced Parkinson's disease with dementia This has been progressively worsening over the past few years.  She was originally  diagnosed in 2011.  Initially followed by neurology at [facility].  Over the last few years has been followed by Dr. Carles Collet in [place].  She was taken off of her medications about a year or 2 ago since she was declining.  Goals of care Patient has not made any improvement since yesterday despite D5 water.  Even if she makes a slight improvement her overall prognosis is quite poor.  She has not been eating or drinking anything for the past several days.  Her life expectancy is less than 2 weeks.  She is otherwise stable.  Patient's husband and daughter are agreeable to residential hospice.  We will consult social worker.  Pressure injury stage II sacral area Pressure Injury 03/08/2021 Coccyx Posterior;Medial Stage 2 -  Partial thickness loss of dermis presenting as a shallow open injury with a red, pink wound bed without slough. 0.5cm x 0.5cm x 0.1cm loss of dermis with eccymosis surrounding tissue (Active)  03/17/2021 1830  Location: Coccyx  Location Orientation: Posterior;Medial  Staging: Stage 2 -  Partial thickness loss of dermis presenting as a shallow open injury with a red, pink wound bed without slough.  Wound Description (Comments): 0.5cm x 0.5cm x 0.1cm loss of dermis with eccymosis surrounding tissue  Present on Admission: Yes     DVT Prophylaxis: Lovenox will be stopped as transitioning to comfort care. Code Status: DNR Family Communication: Discussed with patient's husband and daughter Disposition Plan: Residential hospice  Status is: Inpatient  Remains inpatient appropriate because:Altered mental status and Inpatient level of care appropriate due to severity of illness  Dispo: The patient is from: Home  Anticipated d/c is to:  Residential hospice              Patient currently is not medically stable to d/c.   Difficult to place patient No      Medications: Scheduled:  enoxaparin (LOVENOX) injection  40 mg Subcutaneous Q24H   influenza vaccine  adjuvanted  0.5 mL Intramuscular Tomorrow-1000   Continuous:  dextrose 75 mL/hr at 03/25/21 0805   JJH:ERDEYCXKGYJEH **OR** acetaminophen, hydrALAZINE, ondansetron **OR** ondansetron (ZOFRAN) IV  Antibiotics: Anti-infectives (From admission, onward)    None       Objective:  Vital Signs  Vitals:   02/27/2021 1830 02/25/2021 2145 03/25/21 0125 03/25/21 0503  BP: (!) 179/95 (!) 157/85 (!) 151/84 (!) 157/86  Pulse: 100 (!) 107 (!) 106 (!) 106  Resp: 18 15 20 20   Temp: 98.5 F (36.9 C) 98.7 F (37.1 C) 98.5 F (36.9 C) 98.9 F (37.2 C)  TempSrc: Oral Axillary Oral Oral  SpO2: 99% 95% 96% 96%  Weight:      Height:        Intake/Output Summary (Last 24 hours) at 03/25/2021 0953 Last data filed at 03/25/2021 0514 Gross per 24 hour  Intake --  Output 225 ml  Net -225 ml   Filed Weights   03/11/2021 1215 03/22/2021 1510  Weight: 52.2 kg 52.2 kg    General appearance: Eyes are open but not responsive.  More lethargic today compared to yesterday. Resp: Clear to auscultation bilaterally.  Normal effort Cardio: S1-S2 is normal regular.  No S3-S4.  No rubs murmurs or bruit GI: Abdomen is soft.  Nontender nondistended.  Bowel sounds are present normal.  No masses organomegaly   Lab Results:  Data Reviewed: I have personally reviewed following labs and imaging studies  CBC: Recent Labs  Lab 03/21/2021 1425 03/25/21 0335  WBC 17.8* 18.6*  NEUTROABS 15.8*  --   HGB 14.1 12.9  HCT 47.1* 42.6  MCV 95.3 94.0  PLT 294 631    Basic Metabolic Panel: Recent Labs  Lab 03/17/2021 1425 03/25/21 0335  NA 163* 162*  K 3.2* 3.5  CL 124* 127*  CO2 29 29  GLUCOSE 120* 146*  BUN 57* 56*  CREATININE 1.07* 1.00  CALCIUM 10.0 10.1  MG  --  2.3    GFR: Estimated Creatinine Clearance: 37.6 mL/min (by C-G formula based on SCr of 1 mg/dL).  Liver Function Tests: Recent Labs  Lab 03/23/2021 1425  AST 30  ALT 20  ALKPHOS 67  BILITOT 1.0  PROT 6.4*  ALBUMIN 3.1*     Recent  Results (from the past 240 hour(s))  Resp Panel by RT-PCR (Flu A&B, Covid) Nasopharyngeal Swab     Status: None   Collection Time: 02/28/2021  5:30 PM   Specimen: Nasopharyngeal Swab; Nasopharyngeal(NP) swabs in vial transport medium  Result Value Ref Range Status   SARS Coronavirus 2 by RT PCR NEGATIVE NEGATIVE Final    Comment: (NOTE) SARS-CoV-2 target nucleic acids are NOT DETECTED.  The SARS-CoV-2 RNA is generally detectable in upper respiratory specimens during the acute phase of infection. The lowest concentration of SARS-CoV-2 viral copies this assay can detect is 138 copies/mL. A negative result does not preclude SARS-Cov-2 infection and should not be used as the sole basis for treatment or other patient management decisions. A negative result may occur with  improper specimen collection/handling, submission of specimen other than nasopharyngeal swab, presence of viral mutation(s) within the areas targeted by this assay, and inadequate number of  viral copies(<138 copies/mL). A negative result must be combined with clinical observations, patient history, and epidemiological information. The expected result is Negative.  Fact Sheet for Patients:  EntrepreneurPulse.com.au  Fact Sheet for Healthcare Providers:  IncredibleEmployment.be  This test is no t yet approved or cleared by the [place] FDA and  has been authorized for detection and/or diagnosis of SARS-CoV-2 by FDA under an Emergency Use Authorization (EUA). This EUA will remain  in effect (meaning this test can be used) for the duration of the COVID-19 declaration under Section 564(b)(1) of the Act, 21 U.S.C.section 360bbb-3(b)(1), unless the authorization is terminated  or revoked sooner.       Influenza A by PCR NEGATIVE NEGATIVE Final   Influenza B by PCR NEGATIVE NEGATIVE Final    Comment: (NOTE) The Xpert Xpress SARS-CoV-2/FLU/RSV plus assay is intended as an aid in the  diagnosis of influenza from Nasopharyngeal swab specimens and should not be used as a sole basis for treatment. Nasal washings and aspirates are unacceptable for Xpert Xpress SARS-CoV-2/FLU/RSV testing.  Fact Sheet for Patients: EntrepreneurPulse.com.au  Fact Sheet for Healthcare Providers: IncredibleEmployment.be  This test is not yet approved or cleared by the [place] FDA and has been authorized for detection and/or diagnosis of SARS-CoV-2 by FDA under an Emergency Use Authorization (EUA). This EUA will remain in effect (meaning this test can be used) for the duration of the COVID-19 declaration under Section 564(b)(1) of the Act, 21 U.S.C. section 360bbb-3(b)(1), unless the authorization is terminated or revoked.  Performed at [facility] [place] [address] [place], [place] [address]       Radiology Studies: [facility] Chest Port 1 View  Result Date: 03/22/2021 CLINICAL DATA:  Short of breath and weakness EXAM: PORTABLE CHEST 1 VIEW COMPARISON:  08/23/2016 FINDINGS: Heart size and vascularity normal. Negative for heart failure. Lungs clear without infiltrate or effusion. Chronic right rib fractures. IMPRESSION: No active disease. Electronically Signed   By: Franchot Gallo M.D.   On: 02/23/2021 13:22       LOS: 1 day   [facility] Pager on [email or web address]  03/25/2021, 9:53 AM

## 2021-03-25 NOTE — Plan of Care (Signed)
  Problem: Education: Goal: Knowledge of General Education information will improve Description Including pain rating scale, medication(s)/side effects and non-pharmacologic comfort measures Outcome: Progressing   

## 2021-03-25 NOTE — TOC Initial Note (Signed)
Transition of Care [facility]) - Initial/Assessment Note    Patient Details  Name: Elizabeth Bauer MRN: [id number] Date of Birth: Jan 25, 1941  [facility] of [facility]) CM/SW Contact:    Lennart Pall, LCSW Phone Number: 03/25/2021, 2:25 PM  Clinical Narrative:                  Orders for TOC placed to assist with pt placement in Hospice facility. Met with pt's spouse and daughter in the room who confirm their agreement with this plan and request referral to [facility].  Have spoken with Fraser Din with [facility] who will open case and review for appropriateness.  She does note that the [facility] does not have any available beds today.  TOC will continue to follow.    Expected Discharge Plan: [place] Barriers to Discharge: Continued Medical Work up, Hospice Bed not available   Patient Goals and CMS Choice Patient states their goals for this hospitalization and ongoing recovery are:: comfort      Expected Discharge Plan and Services Expected Discharge Plan: [place] In-house Referral: Clinical Social Work     Living arrangements for the past 2 months: Single Family Home                 DME Arranged: N/A DME Agency: NA                  Prior Living Arrangements/Services Living arrangements for the past 2 months: Single Family Home Lives with:: Self Patient language and need for interpreter reviewed:: No        Need for Family Participation in Patient Care: Yes (Comment) Care giver support system in place?: Yes (comment)   Criminal Activity/Legal Involvement Pertinent to Current Situation/Hospitalization: No - Comment as needed  Activities of Daily Living Home Assistive Devices/Equipment: Other (Comment) ("electric cart", "4 wheel wheelchair", "the house is totally handicap accessible") ADL Screening (condition at time of admission) Patient's cognitive ability adequate to safely complete daily activities?: No Is the  patient deaf or have difficulty hearing?: No Does the patient have difficulty seeing, even when wearing glasses/contacts?: No Does the patient have difficulty concentrating, remembering, or making decisions?: Yes Patient able to express need for assistance with ADLs?: No Does the patient have difficulty dressing or bathing?: Yes Independently performs ADLs?: No Communication: Needs assistance Is this a change from baseline?: Pre-admission baseline Dressing (OT): Needs assistance Is this a change from baseline?: Pre-admission baseline Grooming: Needs assistance Is this a change from baseline?: Pre-admission baseline Feeding: Needs assistance Is this a change from baseline?: Pre-admission baseline Bathing: Needs assistance Is this a change from baseline?: Pre-admission baseline Toileting: Needs assistance Is this a change from baseline?: Pre-admission baseline In/Out Bed: Needs assistance Is this a change from baseline?: Pre-admission baseline Walks in Home: Dependent Is this a change from baseline?: Pre-admission baseline Does the patient have difficulty walking or climbing stairs?: Yes Weakness of Legs: Both Weakness of Arms/Hands: Both  Permission Sought/Granted                  Emotional Assessment Appearance:: Appears stated age Attitude/Demeanor/Rapport: Unable to Assess     Alcohol / Substance Use: Not Applicable Psych Involvement: No (comment)  Admission diagnosis:  Hypernatremia [E87.0] New onset a-fib ([place]) [I48.91] Parkinson's disease ([place]) [G20] Patient Active Problem List   Diagnosis Date Noted   Pressure injury of skin 03/25/2021   Hypernatremia 03/10/2021   Hypokalemia 03/11/2021   Parkinson's disease ([place])  02/26/2021   Dementia due to Parkinson's disease without behavioral disturbance ([place]) 03/15/2021   Closed fracture of three ribs with routine healing, right 08/24/2016   Rib fracture 08/23/2016   Cognitive decline 11/09/2011   Primary freezing of  gait 11/09/2011   PCP:  Josetta Huddle, MD Pharmacy:   [id number] [place], [place] [address] Phone: [phone] Fax: [phone]     Social Determinants of Health (SDOH) Interventions    Readmission Risk Interventions No flowsheet data found.

## 2021-03-25 NOTE — Plan of Care (Signed)
  Problem: Coping: Goal: Level of anxiety will decrease 03/25/2021 2250 by Abagail Kitchens, RN Outcome: Progressing 03/25/2021 2246 by Abagail Kitchens, RN Outcome: Progressing 03/25/2021 2245 by Abagail Kitchens, RN Outcome: Progressing   Problem: Safety: Goal: Ability to remain free from injury will improve 03/25/2021 2250 by Abagail Kitchens, RN Outcome: Progressing 03/25/2021 2246 by Abagail Kitchens, RN Outcome: Progressing 03/25/2021 2245 by Abagail Kitchens, RN Outcome: Progressing

## 2021-03-25 DEATH — deceased

## 2021-03-26 DIAGNOSIS — F0280 Dementia in other diseases classified elsewhere without behavioral disturbance: Secondary | ICD-10-CM | POA: Diagnosis not present

## 2021-03-26 DIAGNOSIS — G20 Parkinson's disease: Secondary | ICD-10-CM | POA: Diagnosis not present

## 2021-03-26 DIAGNOSIS — Z7189 Other specified counseling: Secondary | ICD-10-CM | POA: Diagnosis not present

## 2021-03-26 DIAGNOSIS — E870 Hyperosmolality and hypernatremia: Secondary | ICD-10-CM | POA: Diagnosis not present

## 2021-03-26 MED ORDER — SCOPOLAMINE 1 MG/3DAYS TD PT72
1.0000 | MEDICATED_PATCH | TRANSDERMAL | Status: DC
  Administered 2021-03-26 – 2021-03-27 (×2): 1.5 mg via TRANSDERMAL
  Filled 2021-03-26 (×2): qty 1

## 2021-03-26 MED ORDER — MORPHINE SULFATE (PF) 2 MG/ML IV SOLN
2.0000 mg | INTRAVENOUS | Status: DC | PRN
Start: 2021-03-26 — End: 2021-03-29
  Administered 2021-03-26 – 2021-03-28 (×10): 2 mg via INTRAVENOUS
  Filled 2021-03-26 (×10): qty 1

## 2021-03-26 NOTE — TOC Progression Note (Signed)
Transition of Care [facility]) - Progression Note    Patient Details  Name: Elizabeth Bauer MRN: [id number] Date of Birth: December 17, 1940  Transition of Care [facility]) CM/SW Contact  Leeroy Cha, RN Phone Number: 03/26/2021, 1:15 PM  Clinical Narrative:    Per cheris kennedy no bed available today.   Expected Discharge Plan: [place] Barriers to Discharge: Continued Medical Work up, Hospice Bed not available  Expected Discharge Plan and Services Expected Discharge Plan: [place] In-house Referral: Clinical Social Work     Living arrangements for the past 2 months: Single Family Home                 DME Arranged: N/A DME Agency: NA                   Social Determinants of Health (SDOH) Interventions    Readmission Risk Interventions No flowsheet data found.

## 2021-03-26 NOTE — Plan of Care (Signed)
  Problem: Pain Managment: Goal: General experience of comfort will improve Outcome: Progressing   

## 2021-03-26 NOTE — Progress Notes (Addendum)
[facility] PROGRESS NOTE   Elizabeth Bauer [id number]:[id number] DOB: February 07, 1941 DOA: 02/24/2021  PCP: Josetta Huddle, MD  Brief History/Interval Summary: 80 y.o. 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  Patient has had a worsening in her condition over the past few weeks where she has stopped eating and drinking.  Patient was unable to provide any history due to altered mental status.  Patient appears to have lost a lot of weight in the last few months as well.  Patient was found to have hypernatremia.  She was hospitalized for further management.    Consultants: None  Procedures: None    Subjective/Interval History: Patient noted to be even less responsive today compared to yesterday.  Noted to have gurgling sounds.  Daughter is at the bedside.  She mentioned that patient had to be suctioned few times overnight.     Assessment/Plan:  Hypernatremia/hypokalemia Elevated sodium level due to poor free water intake due to her advancing Parkinson's disease.  She was placed on D5 infusion with no significant change to her sodium level.  Prognosis is very poor.  Discussed with the patient's husband and daughter.  Patient was transitioned to comfort care.  Plan was to discharge to residential hospice.  However patient appears to be actively declining.    Leukocytosis Likely due to hemoconcentration.  She is afebrile.  No obvious source of infection.  No further work-up at this time.  Elevated blood pressure/tachycardia Hydralazine was ordered as needed for elevated blood pressures.  There was some concern for atrial fibrillation.  However due to her progression towards end-of-life no further work-up initiated.    Advanced Parkinson's disease with dementia This has been progressively worsening over the past few years.  She was originally diagnosed in 2011.  Initially followed by  neurology at [facility].  Over the last few years has been followed by Dr. Carles Collet in [place].  She was taken off of her medications about a year or 2 ago since she was declining.  Goals of care Patient continues to decline.  Was transitioned to comfort care yesterday.  Has oral secretions.  We will stop her IV fluids.  We will add scopolamine patch. Plan is for residential hospice placement however patient appears to be declining.  We will see if residential hospice has a bed for her today.  If they do not then it is quite possible that patient may continue to decline and may become somewhat unstable for transfer.  Continue to monitor.  All these issues were discussed with patient's daughter at bedside.   Pressure injury stage II sacral area Pressure Injury 03/15/2021 Coccyx Posterior;Medial Stage 2 -  Partial thickness loss of dermis presenting as a shallow open injury with a red, pink wound bed without slough. 0.5cm x 0.5cm x 0.1cm loss of dermis with eccymosis surrounding tissue (Active)  03/12/2021 1830  Location: Coccyx  Location Orientation: Posterior;Medial  Staging: Stage 2 -  Partial thickness loss of dermis presenting as a shallow open injury with a red, pink wound bed without slough.  Wound Description (Comments): 0.5cm x 0.5cm x 0.1cm loss of dermis with eccymosis surrounding tissue  Present on Admission: Yes     DVT Prophylaxis: Comfort care Code Status: DNR Family Communication: Discussed with patient's daughter at bedside. Disposition Plan: Residential hospice  Status is: Inpatient  Remains inpatient appropriate because:Altered mental status and Inpatient level of care appropriate due to severity  of illness  Dispo: The patient is from: Home              Anticipated d/c is to:  Residential hospice              Patient currently is not medically stable to d/c.   Difficult to place patient No      Medications: Scheduled:  influenza vaccine adjuvanted  0.5 mL  Intramuscular Tomorrow-1000   scopolamine  1 patch Transdermal Q72H   Continuous:   DUK:[id number] **OR** acetaminophen, antiseptic oral rinse, glycopyrrolate **OR** glycopyrrolate **OR** glycopyrrolate, haloperidol **OR** haloperidol **OR** haloperidol lactate, morphine injection, morphine CONCENTRATE **OR** morphine CONCENTRATE, ondansetron **OR** ondansetron (ZOFRAN) IV, polyvinyl alcohol  Antibiotics: Anti-infectives (From admission, onward)    None       Objective:  Vital Signs  Vitals:   03/25/21 0125 03/25/21 0503 03/25/21 1406 03/26/21 0822  BP: (!) 151/84 (!) 157/86 (!) 163/87 (!) 157/92  Pulse: (!) 106 (!) 106 94 (!) 117  Resp: 20 20 (!) 25 (!) 21  Temp: 98.5 F (36.9 C) 98.9 F (37.2 C) 98.1 F (36.7 C) 98.9 F (37.2 C)  TempSrc: Oral Oral  Axillary  SpO2: 96% 96% 96% 94%  Weight:      Height:        Intake/Output Summary (Last 24 hours) at 03/26/2021 0951 Last data filed at 03/26/2021 0200 Gross per 24 hour  Intake 892.59 ml  Output 50 ml  Net 842.59 ml    Filed Weights   03/18/2021 1215 03/22/2021 1510  Weight: 52.2 kg 52.2 kg   Poorly responsive.  Eyes open.  Gurgling sounds noted. Coarse breath sounds bilateral lungs. S1-S2 is tachycardic regular Abdomen is soft.  Lab Results:  Data Reviewed: I have personally reviewed following labs and imaging studies  CBC: Recent Labs  Lab 03/23/2021 1425 03/25/21 0335  WBC 17.8* 18.6*  NEUTROABS 15.8*  --   HGB 14.1 12.9  HCT 47.1* 42.6  MCV 95.3 94.0  PLT 294 309     Basic Metabolic Panel: Recent Labs  Lab 03/07/2021 1425 03/25/21 0335  NA 163* 162*  K 3.2* 3.5  CL 124* 127*  CO2 29 29  GLUCOSE 120* 146*  BUN 57* 56*  CREATININE 1.07* 1.00  CALCIUM 10.0 10.1  MG  --  2.3     GFR: Estimated Creatinine Clearance: 37.6 mL/min (by C-G formula based on SCr of 1 mg/dL).  Liver Function Tests: Recent Labs  Lab 02/23/2021 1425  AST 30  ALT 20  ALKPHOS 67  BILITOT 1.0  PROT 6.4*   ALBUMIN 3.1*      Recent Results (from the past 240 hour(s))  Resp Panel by RT-PCR (Flu A&B, Covid) Nasopharyngeal Swab     Status: None   Collection Time: 03/03/2021  5:30 PM   Specimen: Nasopharyngeal Swab; Nasopharyngeal(NP) swabs in vial transport medium  Result Value Ref Range Status   SARS Coronavirus 2 by RT PCR NEGATIVE NEGATIVE Final    Comment: (NOTE) SARS-CoV-2 target nucleic acids are NOT DETECTED.  The SARS-CoV-2 RNA is generally detectable in upper respiratory specimens during the acute phase of infection. The lowest concentration of SARS-CoV-2 viral copies this assay can detect is 138 copies/mL. A negative result does not preclude SARS-Cov-2 infection and should not be used as the sole basis for treatment or other patient management decisions. A negative result may occur with  improper specimen collection/handling, submission of specimen other than nasopharyngeal swab, presence of viral mutation(s) within the areas targeted  by this assay, and inadequate number of viral copies(<138 copies/mL). A negative result must be combined with clinical observations, patient history, and epidemiological information. The expected result is Negative.  Fact Sheet for Patients:  EntrepreneurPulse.com.au  Fact Sheet for Healthcare Providers:  IncredibleEmployment.be  This test is no t yet approved or cleared by the [place] FDA and  has been authorized for detection and/or diagnosis of SARS-CoV-2 by FDA under an Emergency Use Authorization (EUA). This EUA will remain  in effect (meaning this test can be used) for the duration of the COVID-19 declaration under Section 564(b)(1) of the Act, 21 U.S.C.section 360bbb-3(b)(1), unless the authorization is terminated  or revoked sooner.       Influenza A by PCR NEGATIVE NEGATIVE Final   Influenza B by PCR NEGATIVE NEGATIVE Final    Comment: (NOTE) The Xpert Xpress SARS-CoV-2/FLU/RSV plus  assay is intended as an aid in the diagnosis of influenza from Nasopharyngeal swab specimens and should not be used as a sole basis for treatment. Nasal washings and aspirates are unacceptable for Xpert Xpress SARS-CoV-2/FLU/RSV testing.  Fact Sheet for Patients: EntrepreneurPulse.com.au  Fact Sheet for Healthcare Providers: IncredibleEmployment.be  This test is not yet approved or cleared by the [place] FDA and has been authorized for detection and/or diagnosis of SARS-CoV-2 by FDA under an Emergency Use Authorization (EUA). This EUA will remain in effect (meaning this test can be used) for the duration of the COVID-19 declaration under Section 564(b)(1) of the Act, 21 U.S.C. section 360bbb-3(b)(1), unless the authorization is terminated or revoked.  Performed at [facility], [place] [address], [place], [place] [address]        Radiology Studies: [facility] Chest Port 1 View  Result Date: 03/19/2021 CLINICAL DATA:  Short of breath and weakness EXAM: PORTABLE CHEST 1 VIEW COMPARISON:  08/23/2016 FINDINGS: Heart size and vascularity normal. Negative for heart failure. Lungs clear without infiltrate or effusion. Chronic right rib fractures. IMPRESSION: No active disease. Electronically Signed   By: Franchot Gallo M.D.   On: 03/03/2021 13:22       LOS: 2 days   [facility] Pager on [email or web address]  03/26/2021, 9:51 AM

## 2021-03-27 DIAGNOSIS — Z515 Encounter for palliative care: Secondary | ICD-10-CM | POA: Diagnosis not present

## 2021-03-27 NOTE — Plan of Care (Signed)
  Problem: Coping: Goal: Level of anxiety will decrease Outcome: Progressing   Problem: Pain Managment: Goal: General experience of comfort will improve Outcome: Progressing   

## 2021-03-27 NOTE — Progress Notes (Addendum)
[facility] PROGRESS NOTE   Elizabeth Bauer [id number] DOB: 06-24-1941 DOA: 03/09/2021  PCP: Josetta Huddle, MD  Brief History/Interval Summary: 80 y.o. 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  Patient has had a worsening in her condition over the past few weeks where she has stopped eating and drinking.  Patient was unable to provide any history due to altered mental status.  Patient appears to have lost a lot of weight in the last few months as well.  Patient was found to have hypernatremia.  She was hospitalized for further management.    Consultants: None  Procedures: None    Subjective/Interval History: Patient unresponsive.  According to daughter she has been having some apneic spells overnight.  More comfortable this morning compared to yesterday.  Less secretions noted by the daughter.   Assessment/Plan:  Hypernatremia/hypokalemia Elevated sodium level due to poor free water intake due to her advancing Parkinson's disease.  She was placed on D5 infusion with no significant change to her sodium level.  Prognosis thought to be poor.  Discussed with patient's husband and daughter.  She was transitioned to comfort care.    Leukocytosis Likely due to hemoconcentration.  She is afebrile.  No obvious source of infection.  No further work-up at this time.  Elevated blood pressure/tachycardia Hydralazine was ordered as needed for elevated blood pressures.  There was some concern for atrial fibrillation.  However due to her progression towards end-of-life no further work-up initiated.    Advanced Parkinson's disease with dementia This has been progressively worsening over the past few years.  She was originally diagnosed in 2011.  Initially followed by neurology at [facility].  Over the last few years has been followed by Dr. Carles Collet in [place].  She was  taken off of her medications about a year or 2 ago since she was declining.  Goals of care Patient currently comfort care.  Appears to be slowly declining.  Some apneic spells noted by the daughter.  Initially plan was to send her to residential hospice but at this time we anticipate in-hospital death.  Continue to monitor her.  Continue as needed comfort medications.    Pressure injury stage II sacral area Pressure Injury 03/15/2021 Coccyx Posterior;Medial Stage 2 -  Partial thickness loss of dermis presenting as a shallow open injury with a red, pink wound bed without slough. 0.5cm x 0.5cm x 0.1cm loss of dermis with eccymosis surrounding tissue (Active)  02/26/2021 1830  Location: Coccyx  Location Orientation: Posterior;Medial  Staging: Stage 2 -  Partial thickness loss of dermis presenting as a shallow open injury with a red, pink wound bed without slough.  Wound Description (Comments): 0.5cm x 0.5cm x 0.1cm loss of dermis with eccymosis surrounding tissue  Present on Admission: Yes     DVT Prophylaxis: Comfort care Code Status: DNR Family Communication: Discussed with patient's daughter and husband at bedside. Disposition Plan: Anticipate in-hospital death  Status is: Inpatient  Remains inpatient appropriate because:Altered mental status and Inpatient level of care appropriate due to severity of illness  Dispo: The patient is from: Home              Anticipated d/c is to: Anticipate in-hospital death              Patient currently is not medically stable to d/c.   Difficult to place patient No      Medications: Scheduled:  influenza vaccine adjuvanted  0.5 mL Intramuscular Tomorrow-1000   scopolamine  1 patch Transdermal Q72H   Continuous:   XKG:[id number] **OR** acetaminophen, antiseptic oral rinse, glycopyrrolate **OR** glycopyrrolate **OR** glycopyrrolate, haloperidol **OR** haloperidol **OR** haloperidol lactate, morphine injection, morphine CONCENTRATE **OR**  morphine CONCENTRATE, ondansetron **OR** ondansetron (ZOFRAN) IV, polyvinyl alcohol  Antibiotics: Anti-infectives (From admission, onward)    None       Objective:  Vital Signs  Vitals:   03/25/21 0503 03/25/21 1406 03/26/21 0822 03/27/21 0510  BP: (!) 157/86 (!) 163/87 (!) 157/92 (!) 147/79  Pulse: (!) 106 94 (!) 117 (!) 122  Resp: 20 (!) 25 (!) 21 (!) 26  Temp: 98.9 F (37.2 C) 98.1 F (36.7 C) 98.9 F (37.2 C) 97.6 F (36.4 C)  TempSrc: Oral  Axillary Oral  SpO2: 96% 96% 94% 94%  Weight:      Height:       No intake or output data in the 24 hours ending 03/27/21 1152  Filed Weights   03/11/2021 1215 03/18/2021 1510  Weight: 52.2 kg 52.2 kg   Poorly responsive. Coarse breath sounds bilaterally S1-S2 is normal regular Abdomen is soft.  Lab Results:  Data Reviewed: I have personally reviewed following labs and imaging studies  CBC: Recent Labs  Lab 03/04/2021 1425 03/25/21 0335  WBC 17.8* 18.6*  NEUTROABS 15.8*  --   HGB 14.1 12.9  HCT 47.1* 42.6  MCV 95.3 94.0  PLT 294 309     Basic Metabolic Panel: Recent Labs  Lab 02/28/2021 1425 03/25/21 0335  NA 163* 162*  K 3.2* 3.5  CL 124* 127*  CO2 29 29  GLUCOSE 120* 146*  BUN 57* 56*  CREATININE 1.07* 1.00  CALCIUM 10.0 10.1  MG  --  2.3     GFR: Estimated Creatinine Clearance: 37.6 mL/min (by C-G formula based on SCr of 1 mg/dL).  Liver Function Tests: Recent Labs  Lab 03/06/2021 1425  AST 30  ALT 20  ALKPHOS 67  BILITOT 1.0  PROT 6.4*  ALBUMIN 3.1*      Recent Results (from the past 240 hour(s))  Resp Panel by RT-PCR (Flu A&B, Covid) Nasopharyngeal Swab     Status: None   Collection Time: 03/23/2021  5:30 PM   Specimen: Nasopharyngeal Swab; Nasopharyngeal(NP) swabs in vial transport medium  Result Value Ref Range Status   SARS Coronavirus 2 by RT PCR NEGATIVE NEGATIVE Final    Comment: (NOTE) SARS-CoV-2 target nucleic acids are NOT DETECTED.  The SARS-CoV-2 RNA is generally  detectable in upper respiratory specimens during the acute phase of infection. The lowest concentration of SARS-CoV-2 viral copies this assay can detect is 138 copies/mL. A negative result does not preclude SARS-Cov-2 infection and should not be used as the sole basis for treatment or other patient management decisions. A negative result may occur with  improper specimen collection/handling, submission of specimen other than nasopharyngeal swab, presence of viral mutation(s) within the areas targeted by this assay, and inadequate number of viral copies(<138 copies/mL). A negative result must be combined with clinical observations, patient history, and epidemiological information. The expected result is Negative.  Fact Sheet for Patients:  EntrepreneurPulse.com.au  Fact Sheet for Healthcare Providers:  IncredibleEmployment.be  This test is no t yet approved or cleared by the [place] FDA and  has been authorized for detection and/or diagnosis of SARS-CoV-2 by FDA under an Emergency Use Authorization (EUA). This EUA will remain  in effect (meaning this test can be used) for the  duration of the COVID-19 declaration under Section 564(b)(1) of the Act, 21 U.S.C.section 360bbb-3(b)(1), unless the authorization is terminated  or revoked sooner.       Influenza A by PCR NEGATIVE NEGATIVE Final   Influenza B by PCR NEGATIVE NEGATIVE Final    Comment: (NOTE) The Xpert Xpress SARS-CoV-2/FLU/RSV plus assay is intended as an aid in the diagnosis of influenza from Nasopharyngeal swab specimens and should not be used as a sole basis for treatment. Nasal washings and aspirates are unacceptable for Xpert Xpress SARS-CoV-2/FLU/RSV testing.  Fact Sheet for Patients: EntrepreneurPulse.com.au  Fact Sheet for Healthcare Providers: IncredibleEmployment.be  This test is not yet approved or cleared by the [place] FDA  and has been authorized for detection and/or diagnosis of SARS-CoV-2 by FDA under an Emergency Use Authorization (EUA). This EUA will remain in effect (meaning this test can be used) for the duration of the COVID-19 declaration under Section 564(b)(1) of the Act, 21 U.S.C. section 360bbb-3(b)(1), unless the authorization is terminated or revoked.  Performed at [facility], [place] [address], [place], [place] [address]        Radiology Studies: No results found.     LOS: 3 days   Lunah [place] on [email or web address]  03/27/2021, 11:52 AM

## 2021-03-28 DIAGNOSIS — Z515 Encounter for palliative care: Secondary | ICD-10-CM | POA: Diagnosis not present

## 2021-04-17 ENCOUNTER — Ambulatory Visit: Payer: Medicare HMO

## 2021-04-17 ENCOUNTER — Encounter (HOSPITAL_COMMUNITY): Payer: Medicare HMO

## 2021-04-25 NOTE — Progress Notes (Signed)
[facility] PROGRESS NOTE   Elizabeth Bauer QQV:[id number] DOB: 15-Dec-1940 DOA: 03/06/2021  PCP: Josetta Huddle, MD  Brief History/Interval Summary: 80 y.o. 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  Patient has had a worsening in her condition over the past few weeks where she has stopped eating and drinking.  Patient was unable to provide any history due to altered mental status.  Patient appears to have lost a lot of weight in the last few months as well.  Patient was found to have hypernatremia.  She was hospitalized for further management.    Consultants: None  Procedures: None    Subjective/Interval History: Patient remains unresponsive.  Shallow respirations noted today.  Daughter is at the bedside.   Assessment/Plan:  Hypernatremia/hypokalemia Elevated sodium level due to poor free water intake due to her advancing Parkinson's disease.  She was placed on D5 infusion with no significant change to her sodium level.  Prognosis thought to be poor.  Discussed with patient's husband and daughter.  She was transitioned to comfort care.    Leukocytosis Likely due to hemoconcentration.  She is afebrile.  No obvious source of infection.  No further work-up at this time.  Elevated blood pressure/tachycardia Hydralazine was ordered as needed for elevated blood pressures.  There was some concern for atrial fibrillation.  However due to her progression towards end-of-life no further work-up initiated.    Advanced Parkinson's disease with dementia This has been progressively worsening over the past few years.  She was originally diagnosed in 2011.  Initially followed by neurology at [facility].  Over the last few years has been followed by Dr. Carles Collet in [place].  She was taken off of her medications about a year or 2 ago since she was declining.  Goals of  care Seems to be comfortable for the most part.  Does not have high narcotic requirement for pain.  Noted to have shallow respirations today along with the significant tachypnea.  Anticipate in-hospital death.     Pressure injury stage II sacral area Pressure Injury 03/06/2021 Coccyx Posterior;Medial Stage 2 -  Partial thickness loss of dermis presenting as a shallow open injury with a red, pink wound bed without slough. 0.5cm x 0.5cm x 0.1cm loss of dermis with eccymosis surrounding tissue (Active)  03/02/2021 1830  Location: Coccyx  Location Orientation: Posterior;Medial  Staging: Stage 2 -  Partial thickness loss of dermis presenting as a shallow open injury with a red, pink wound bed without slough.  Wound Description (Comments): 0.5cm x 0.5cm x 0.1cm loss of dermis with eccymosis surrounding tissue  Present on Admission: Yes     DVT Prophylaxis: Comfort care Code Status: DNR Family Communication: Discussed with patient's daughter. Disposition Plan: Anticipate in-hospital death  Status is: Inpatient  Remains inpatient appropriate because:Altered mental status and Inpatient level of care appropriate due to severity of illness  Dispo: The patient is from: Home              Anticipated d/c is to: Anticipate in-hospital death              Patient currently is not medically stable to d/c.   Difficult to place patient No      Medications: Scheduled:  influenza vaccine adjuvanted  0.5 mL Intramuscular Tomorrow-1000   scopolamine  1 patch Transdermal Q72H   Continuous:   PPI:[id number] **OR** acetaminophen, antiseptic oral rinse, glycopyrrolate **OR** glycopyrrolate **OR**  glycopyrrolate, haloperidol **OR** haloperidol **OR** haloperidol lactate, morphine injection, morphine CONCENTRATE **OR** morphine CONCENTRATE, ondansetron **OR** ondansetron (ZOFRAN) IV, polyvinyl alcohol  Antibiotics: Anti-infectives (From admission, onward)    None       Objective:  Vital  Signs  Vitals:   03/25/21 1406 03/26/21 0822 03/27/21 0510 03/27/21 2246  BP: (!) 163/87 (!) 157/92 (!) 147/79 (!) 152/74  Pulse: 94 (!) 117 (!) 122 (!) 111  Resp: (!) 25 (!) 21 (!) 26 (!) 27  Temp: 98.1 F (36.7 C) 98.9 F (37.2 C) 97.6 F (36.4 C) 98.6 F (37 C)  TempSrc:  Axillary Oral Oral  SpO2: 96% 94% 94% 94%  Weight:      Height:        Intake/Output Summary (Last 24 hours) at 2021/04/12 0935 Last data filed at 2021-04-12 0753 Gross per 24 hour  Intake 120 ml  Output --  Net 120 ml    Filed Weights   03/08/2021 1215 03/21/2021 1510  Weight: 52.2 kg 52.2 kg   Unresponsive. Coarse breath sounds bilaterally.  Noted to be tachypneic. S1-S2 is normal regular  Lab Results:  Data Reviewed: I have personally reviewed following labs and imaging studies  CBC: Recent Labs  Lab 03/15/2021 1425 03/25/21 0335  WBC 17.8* 18.6*  NEUTROABS 15.8*  --   HGB 14.1 12.9  HCT 47.1* 42.6  MCV 95.3 94.0  PLT 294 309     Basic Metabolic Panel: Recent Labs  Lab 03/21/2021 1425 03/25/21 0335  NA 163* 162*  K 3.2* 3.5  CL 124* 127*  CO2 29 29  GLUCOSE 120* 146*  BUN 57* 56*  CREATININE 1.07* 1.00  CALCIUM 10.0 10.1  MG  --  2.3     GFR: Estimated Creatinine Clearance: 37.6 mL/min (by C-G formula based on SCr of 1 mg/dL).  Liver Function Tests: Recent Labs  Lab 03/18/2021 1425  AST 30  ALT 20  ALKPHOS 67  BILITOT 1.0  PROT 6.4*  ALBUMIN 3.1*      Recent Results (from the past 240 hour(s))  Resp Panel by RT-PCR (Flu A&B, Covid) Nasopharyngeal Swab     Status: None   Collection Time: 03/19/2021  5:30 PM   Specimen: Nasopharyngeal Swab; Nasopharyngeal(NP) swabs in vial transport medium  Result Value Ref Range Status   SARS Coronavirus 2 by RT PCR NEGATIVE NEGATIVE Final    Comment: (NOTE) SARS-CoV-2 target nucleic acids are NOT DETECTED.  The SARS-CoV-2 RNA is generally detectable in upper respiratory specimens during the acute phase of infection. The  lowest concentration of SARS-CoV-2 viral copies this assay can detect is 138 copies/mL. A negative result does not preclude SARS-Cov-2 infection and should not be used as the sole basis for treatment or other patient management decisions. A negative result may occur with  improper specimen collection/handling, submission of specimen other than nasopharyngeal swab, presence of viral mutation(s) within the areas targeted by this assay, and inadequate number of viral copies(<138 copies/mL). A negative result must be combined with clinical observations, patient history, and epidemiological information. The expected result is Negative.  Fact Sheet for Patients:  EntrepreneurPulse.com.au  Fact Sheet for Healthcare Providers:  IncredibleEmployment.be  This test is no t yet approved or cleared by the [place] FDA and  has been authorized for detection and/or diagnosis of SARS-CoV-2 by FDA under an Emergency Use Authorization (EUA). This EUA will remain  in effect (meaning this test can be used) for the duration of the COVID-19 declaration under Section 564(b)(1) of  the Act, 21 U.S.C.section 360bbb-3(b)(1), unless the authorization is terminated  or revoked sooner.       Influenza A by PCR NEGATIVE NEGATIVE Final   Influenza B by PCR NEGATIVE NEGATIVE Final    Comment: (NOTE) The Xpert Xpress SARS-CoV-2/FLU/RSV plus assay is intended as an aid in the diagnosis of influenza from Nasopharyngeal swab specimens and should not be used as a sole basis for treatment. Nasal washings and aspirates are unacceptable for Xpert Xpress SARS-CoV-2/FLU/RSV testing.  Fact Sheet for Patients: EntrepreneurPulse.com.au  Fact Sheet for Healthcare Providers: IncredibleEmployment.be  This test is not yet approved or cleared by the [place] FDA and has been authorized for detection and/or diagnosis of SARS-CoV-2 by FDA under  an Emergency Use Authorization (EUA). This EUA will remain in effect (meaning this test can be used) for the duration of the COVID-19 declaration under Section 564(b)(1) of the Act, 21 U.S.C. section 360bbb-3(b)(1), unless the authorization is terminated or revoked.  Performed at [facility], [place] [address], [place], [place] [address]        Radiology Studies: No results found.     LOS: 4 days   Katey Barrie [employer] on [email or web address]  2021/04/11, 9:35 AM

## 2021-04-25 NOTE — Progress Notes (Signed)
Pt family left @approx . 2030. Post-mortem care of body completed and pt placed in body back with toe and thumb ID tags. PIV removed. Awaiting transport to [place].

## 2021-04-25 NOTE — Death Summary Note (Signed)
  DEATH SUMMARY   Patient Details  Name: Elizabeth Bauer MRN: [id number] DOB: 1940-06-29  Admission/Discharge Information   Admit Date:  Apr 10, 2021  Date of Death: Date of Death: 2021-04-14  Time of Death: Time of Death: 09-26-1847  Length of Stay: 4  Referring Physician: Josetta Huddle, MD   Reason(s) for Hospitalization  Parkinson's disease with dementia  Diagnoses  Preliminary cause of death: Parkinson's disease [facility])  Secondary Diagnoses (including complications and co-morbidities):    Hypernatremia   Hypokalemia   Dementia due to Parkinson's disease without behavioral disturbance (HCC)   Pressure injury of skin   Brief Hospital Course (including significant findings, care, treatment, and services provided and events leading to death)   Brief History: 80 y.o. 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  Patient has had a worsening in her condition over the past few weeks where she has stopped eating and drinking.  Patient was unable to provide any history due to altered mental status.  Patient appears to have lost a lot of weight in the last few months as well.  Patient was found to have hypernatremia.  She was hospitalized for further management.    Hospital course   Hypernatremia/hypokalemia Elevated sodium level due to poor free water intake due to her advancing Parkinson's disease.  She was placed on D5 infusion with no significant change to her sodium level.  Prognosis thought to be poor.  Discussed with patient's husband and daughter.  She was transitioned to comfort care.     Leukocytosis Likely due to hemoconcentration.  She is afebrile.  No obvious source of infection was found.    Elevated blood pressure/tachycardia Hydralazine was ordered as needed for elevated blood pressures.  There was some concern for atrial fibrillation.  However due to her progression towards  end-of-life no further work-up initiated.    Advanced Parkinson's disease with dementia This has been progressively worsening over the past few years.  She was originally diagnosed in Sep 25, 2009.  Initially followed by neurology at [facility].  Over the last few years has been followed by Dr. Carles Collet in [place].  She was taken off of her medications about a year or 2 ago since she was declining.   Pressure injury stage II sacral area Pressure Injury 04-10-21 Coccyx Posterior;Medial Stage 2 -  Partial thickness loss of dermis presenting as a shallow open injury with a red, pink wound bed without slough. 0.5cm x 0.5cm x 0.1cm loss of dermis with eccymosis surrounding tissue (Active)  04-10-21 1830  Location: Coccyx  Location Orientation: Posterior;Medial  Staging: Stage 2 -  Partial thickness loss of dermis presenting as a shallow open injury with a red, pink wound bed without slough.  Wound Description (Comments): 0.5cm x 0.5cm x 0.1cm loss of dermis with eccymosis surrounding tissue  Present on Admission: Yes    Patient expired on 04-14-2021 at 6:49 PM  Pertinent Labs and Studies  Significant Diagnostic Studies DG Chest Port 1 View  Result Date: 04-10-21 CLINICAL DATA:  Short of breath and weakness EXAM: PORTABLE CHEST 1 VIEW COMPARISON:  08/23/2016 FINDINGS: Heart size and vascularity normal. Negative for heart failure. Lungs clear without infiltrate or effusion. Chronic right rib fractures. IMPRESSION: No active disease. Electronically Signed   By: Franchot Gallo M.D.   On: 2021-04-10 13:22      Bonnielee Haff 04/05/2021, 1:10 PM

## 2021-04-25 NOTE — Progress Notes (Signed)
Pt expired at [place] with daughter and husband at bedside. MD notified. See post mortem checklist.

## 2021-04-25 DEATH — deceased
